# Patient Record
Sex: Male | Born: 1995 | Race: Black or African American | Hispanic: No | Marital: Single | Smoking: Never smoker
Health system: Southern US, Community
[De-identification: ages and names within clinical notes are randomized; demographics above are authoritative.]

## PROBLEM LIST (undated history)

## (undated) DIAGNOSIS — D571 Sickle-cell disease without crisis: Secondary | ICD-10-CM

## (undated) DIAGNOSIS — J45909 Unspecified asthma, uncomplicated: Secondary | ICD-10-CM

## (undated) HISTORY — PX: SPLENECTOMY: SUR1306

---
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
## 2014-03-01 ENCOUNTER — Emergency Department (HOSPITAL_COMMUNITY)
Admission: EM | Admit: 2014-03-01 | Discharge: 2014-03-01 | Disposition: A | Discharge status: Home / Self Care | Payer: No Typology Code available for payment source | Attending: Emergency Medicine | Admitting: Emergency Medicine

## 2014-03-01 ENCOUNTER — Encounter (HOSPITAL_COMMUNITY): Payer: Self-pay | Admitting: Emergency Medicine

## 2014-03-01 ENCOUNTER — Inpatient Hospital Stay (HOSPITAL_COMMUNITY)
Admission: EM | Admit: 2014-03-01 | Discharge: 2014-03-05 | DRG: 811 | Disposition: A | Discharge status: Home / Self Care | Payer: No Typology Code available for payment source | Attending: Internal Medicine | Admitting: Internal Medicine

## 2014-03-01 DIAGNOSIS — Z9081 Acquired absence of spleen: Secondary | ICD-10-CM | POA: Diagnosis present

## 2014-03-01 DIAGNOSIS — Z79899 Other long term (current) drug therapy: Secondary | ICD-10-CM

## 2014-03-01 DIAGNOSIS — J189 Pneumonia, unspecified organism: Secondary | ICD-10-CM | POA: Diagnosis not present

## 2014-03-01 DIAGNOSIS — D5700 Hb-SS disease with crisis, unspecified: Secondary | ICD-10-CM | POA: Insufficient documentation

## 2014-03-01 DIAGNOSIS — J45909 Unspecified asthma, uncomplicated: Secondary | ICD-10-CM | POA: Insufficient documentation

## 2014-03-01 DIAGNOSIS — E871 Hypo-osmolality and hyponatremia: Secondary | ICD-10-CM | POA: Insufficient documentation

## 2014-03-01 DIAGNOSIS — R0902 Hypoxemia: Secondary | ICD-10-CM | POA: Diagnosis present

## 2014-03-01 DIAGNOSIS — K5901 Slow transit constipation: Secondary | ICD-10-CM | POA: Insufficient documentation

## 2014-03-01 DIAGNOSIS — R61 Generalized hyperhidrosis: Secondary | ICD-10-CM | POA: Insufficient documentation

## 2014-03-01 DIAGNOSIS — R0602 Shortness of breath: Secondary | ICD-10-CM

## 2014-03-01 DIAGNOSIS — M545 Low back pain: Secondary | ICD-10-CM | POA: Diagnosis not present

## 2014-03-01 LAB — COMPREHENSIVE METABOLIC PANEL
ALT: 22 U/L (ref 0–53)
AST: 37 U/L (ref 0–37)
Albumin: 4.9 g/dL (ref 3.5–5.2)
Alkaline Phosphatase: 57 U/L (ref 39–117)
Anion gap: 12 (ref 5–15)
BUN: 10 mg/dL (ref 6–23)
CALCIUM: 9.7 mg/dL (ref 8.4–10.5)
CO2: 24 mmol/L (ref 19–32)
Chloride: 104 mEq/L (ref 96–112)
Creatinine, Ser: 1.04 mg/dL (ref 0.50–1.35)
GFR calc Af Amer: >90 mL/min (ref >90)
GFR calc non Af Amer: >90 mL/min (ref >90)
GLUCOSE: 103 mg/dL — AB (ref 70–99)
POTASSIUM: 3.9 mmol/L (ref 3.5–5.1)
Sodium: 140 mmol/L (ref 135–145)
Total Bilirubin: 2.3 mg/dL — ABNORMAL HIGH (ref 0.3–1.2)
Total Protein: 7.4 g/dL (ref 6.0–8.3)

## 2014-03-01 LAB — CBC WITH DIFFERENTIAL/PLATELET
Basophils Absolute: 0.1 10*3/uL (ref 0.0–0.1)
Basophils Relative: 1 % (ref 0–1)
Eosinophils Absolute: 0.3 10*3/uL (ref 0.0–0.7)
Eosinophils Relative: 2 % (ref 0–5)
HCT: 32.8 % — ABNORMAL LOW (ref 39.0–52.0)
Hemoglobin: 11.9 g/dL — ABNORMAL LOW (ref 13.0–17.0)
Lymphocytes Relative: 23 % (ref 12–46)
Lymphs Abs: 3.1 10*3/uL (ref 0.7–4.0)
MCH: 30.9 pg (ref 26.0–34.0)
MCHC: 36.3 g/dL — ABNORMAL HIGH (ref 30.0–36.0)
MCV: 85.2 fL (ref 78.0–100.0)
Monocytes Absolute: 1.1 10*3/uL — ABNORMAL HIGH (ref 0.1–1.0)
Monocytes Relative: 8 % (ref 3–12)
Neutro Abs: 8.8 10*3/uL — ABNORMAL HIGH (ref 1.7–7.7)
Neutrophils Relative %: 66 % (ref 43–77)
Platelets: 307 10*3/uL (ref 150–400)
RBC: 3.85 MIL/uL — ABNORMAL LOW (ref 4.22–5.81)
RDW: 20.7 % — ABNORMAL HIGH (ref 11.5–15.5)
WBC: 13.4 10*3/uL — ABNORMAL HIGH (ref 4.0–10.5)

## 2014-03-01 LAB — RETICULOCYTES
RBC.: 3.85 MIL/uL — ABNORMAL LOW (ref 4.22–5.81)
Retic Count, Absolute: 462 10*3/uL — ABNORMAL HIGH (ref 19.0–186.0)
Retic Ct Pct: 12 % — ABNORMAL HIGH (ref 0.4–3.1)

## 2014-03-01 MED ORDER — OXYCODONE-ACETAMINOPHEN 10-325 MG PO TABS: 1.0000 | ORAL_TABLET | Freq: Four times a day (QID) | ORAL | Status: DC | PRN

## 2014-03-01 MED ORDER — ONDANSETRON 4 MG PO TBDP: 4.0000 mg | ORAL_TABLET | Freq: Three times a day (TID) | ORAL | Status: DC | PRN

## 2014-03-01 MED ORDER — HYDROMORPHONE HCL 1 MG/ML IJ SOLN
1.0000 mg | Freq: Once | INTRAMUSCULAR | Status: AC
  Administered 2014-03-01: 1 mg via INTRAVENOUS
  Filled 2014-03-01: qty 1

## 2014-03-01 MED ORDER — HYDROMORPHONE HCL 1 MG/ML IJ SOLN
2.0000 mg | Freq: Once | INTRAMUSCULAR | Status: AC
  Administered 2014-03-01: 2 mg via INTRAVENOUS
  Filled 2014-03-01: qty 2

## 2014-03-01 MED ORDER — SODIUM CHLORIDE 0.9 % IV SOLN
Freq: Once | INTRAVENOUS | Status: AC
  Administered 2014-03-01: 13:00:00 via INTRAVENOUS

## 2014-03-01 MED ORDER — ONDANSETRON HCL 4 MG/2ML IJ SOLN
4.0000 mg | Freq: Once | INTRAMUSCULAR | Status: AC
  Administered 2014-03-01: 4 mg via INTRAVENOUS
  Filled 2014-03-01: qty 2

## 2014-03-01 MED ORDER — FENTANYL CITRATE 0.05 MG/ML IJ SOLN
100.0000 ug | Freq: Once | INTRAMUSCULAR | Status: AC
  Administered 2014-03-01: 100 ug via INTRAVENOUS
  Filled 2014-03-01: qty 2

## 2014-03-01 MED ORDER — SODIUM CHLORIDE 0.9 % IV SOLN
Freq: Once | INTRAVENOUS | Status: AC
  Administered 2014-03-01: 15:00:00 via INTRAVENOUS

## 2014-03-01 MED ORDER — OXYCODONE-ACETAMINOPHEN 5-325 MG PO TABS
2.0000 | ORAL_TABLET | Freq: Once | ORAL | Status: AC
  Administered 2014-03-01: 2 via ORAL
  Filled 2014-03-01: qty 2

## 2014-03-01 MED ORDER — LORAZEPAM 2 MG/ML IJ SOLN
1.0000 mg | Freq: Once | INTRAMUSCULAR | Status: AC
  Administered 2014-03-01: 1 mg via INTRAVENOUS
  Filled 2014-03-01: qty 1

## 2014-03-01 MED ORDER — FENTANYL CITRATE 0.05 MG/ML IJ SOLN: 100.0000 ug | Freq: Once | INTRAMUSCULAR | Status: DC

## 2014-03-01 NOTE — ED Notes (Signed)
Pt. reports recurrent low back pain / sickle cell pain this evening , pt. seen here this afternoon for the same complaint ( blood tests done ) received pain medications and discharged home with prescriptions.

## 2014-03-01 NOTE — ED Notes (Addendum)
Notified EDP of patient pain 10/10 sharp moaning and screaming.

## 2014-03-01 NOTE — ED Provider Notes (Signed)
CSN: [id number]     Arrival date & time 03/01/14  1209 History   First MD Initiated Contact with Patient 03/01/14 1226     Chief Complaint  Patient presents with  . Sickle Cell Pain Crisis     (Consider location/radiation/quality/duration/timing/severity/associated sxs/prior Treatment) Patient is a 19 y.o. male presenting with sickle cell pain. The history is provided by the patient and a relative.  Sickle Cell Pain Crisis Location:  Back Severity:  Unable to specify Onset quality:  Sudden Duration:  2 hours Similar to previous crisis episodes: yes   Timing:  Constant Progression:  Unable to specify Chronicity:  Recurrent Relieved by:  Nothing Worsened by:  Nothing tried   Past Medical History  Diagnosis Date  . Asthma   . Sickle cell disease    Past Surgical History  Procedure Laterality Date  . Splenectomy     No family history on file. History  Substance Use Topics  . Smoking status: Not on file  . Smokeless tobacco: Not on file  . Alcohol Use: No    Review of Systems  Unable to perform ROS: Acuity of condition      Allergies  Review of patient's allergies indicates no known allergies.  Home Medications   Prior to Admission medications   Medication Sig Start Date End Date Taking? Authorizing Provider  hydroxyurea (HYDREA) 500 MG capsule Take 1,500 mg by mouth daily. May take with food to minimize GI side effects.    Historical Provider, MD  ondansetron (ZOFRAN ODT) 4 MG disintegrating tablet Take 1 tablet (4 mg total) by mouth every 8 (eight) hours as needed for nausea. 05/03/12   Larene Pickett, PA-C  oxyCODONE-acetaminophen (PERCOCET) 5-325 MG per tablet Take 1 tablet by mouth every 4 (four) hours as needed for pain. 05/03/12   Larene Pickett, PA-C   BP 134/117 mmHg  Pulse 116  Temp(Src) 98.4 F (36.9 C) (Oral)  Resp 33  Ht 5\' 9"  (1.753 m)  Wt 150 lb (68.04 kg)  BMI 22.14 kg/m2  SpO2 100% Physical Exam  Constitutional: He is oriented to person,  place, and time. He appears well-developed and well-nourished. He appears distressed.  Young  M writhing in bed  HENT:  Head: Normocephalic and atraumatic.  Eyes: Conjunctivae and EOM are normal.  Cardiovascular: Normal rate and regular rhythm.   Pulmonary/Chest: Effort normal. No stridor. No respiratory distress.  Abdominal: He exhibits no distension.  Musculoskeletal: He exhibits no edema.  Neurological: He is alert and oriented to person, place, and time.  MAES  Skin: Skin is warm. He is diaphoretic.  Nursing note and vitals reviewed.   ED Course  Procedures (including critical care time) Labs Review Labs Reviewed  CBC WITH DIFFERENTIAL - Abnormal; Notable for the following:    WBC 13.4 (*)    RBC 3.85 (*)    Hemoglobin 11.9 (*)    HCT 32.8 (*)    MCHC 36.3 (*)    RDW 20.7 (*)    Neutro Abs 8.8 (*)    Monocytes Absolute 1.1 (*)    All other components within normal limits  COMPREHENSIVE METABOLIC PANEL - Abnormal; Notable for the following:    Glucose, Bld 103 (*)    Total Bilirubin 2.3 (*)    All other components within normal limits  RETICULOCYTES - Abnormal; Notable for the following:    Retic Ct Pct 12.0 (*)    RBC. 3.85 (*)    Retic Count, Manual 462.0 (*)  All other components within normal limits   Update: Patient is some potential improvement of a first dose of analgesics. He now states that he has recently been having increased his activity, and presumes that the increase is partially responsible for his pain episode. He corroborates his brothers telling of the history of present illness.  Update: Following fluids, the patient is improved.   4:18 PM Pain 2/10, patient in no distress.  MDM  Young male with sickle cell disease presents with acute low back pain. No evidence for neurologic deficiency, and the patient's vital signs are essentially stable. Patient did have some decreased respiratory drive while receiving narcotics, but was awake and alert,  with clear breath sounds, and no chest pain or dyspnea acknowledged by him. Low suspicion for acute chest or other pathology. Labs reassuring.  [name], MD 03/01/14 [phone]

## 2014-03-01 NOTE — ED Notes (Signed)
EDP at bedside  

## 2014-03-01 NOTE — Discharge Instructions (Signed)
As discussed, it is important that you follow up as soon as possible with our clinic for continued management of your condition.  If you develop any new, or concerning changes in your condition, please return to the emergency department immediately.   Sickle Cell Anemia Sickle cell anemia is a condition where your red blood cells are shaped like sickles. Red blood cells carry oxygen through the body. Sickle-shaped red blood cells do not live as long as normal red blood cells. They also clump together and block blood from flowing through the blood vessels. These things prevent the body from getting enough oxygen. Sickle cell anemia causes organ damage and pain. It also increases the risk of infection. HOME CARE  Drink enough fluid to keep your pee (urine) clear or pale yellow. Drink more in hot weather and during exercise.  Do not smoke. Smoking lowers oxygen levels in the blood.  Only take over-the-counter or prescription medicines as told by your doctor.  Take antibiotic medicines as told by your doctor. Make sure you finish them even if you start to feel better.  Take supplements as told by your doctor.  Consider wearing a medical alert bracelet. This tells anyone caring for you in an emergency of your condition.  When traveling, keep your medical information, doctors' names, and the medicines you take with you at all times.  If you have a fever, do not take fever medicines right away. This could cover up a problem. Tell your doctor.   Keep all follow-up visits with your doctor. Sickle cell anemia requires regular medical care. GET HELP IF: You have a fever. GET HELP RIGHT AWAY IF:  You feel dizzy or faint.  You have new belly (abdominal) pain, especially on the left side near the stomach area.  You have a lasting, often uncomfortable and painful erection of the penis (priapism). If it is not treated right away, you will become unable to have sex (impotence).  You have numbness  in your arms or legs or you have a hard time moving them.  You have a hard time talking.  You have a fever or lasting symptoms for more than 2-3 days.  You have a fever and your symptoms suddenly get worse.  You have signs or symptoms of infection. These include:  Chills.  Being more tired than normal (lethargy).  Irritability.  Poor eating.  Throwing up (vomiting).  You have pain that is not helped with medicine.  You have shortness of breath.  You have pain in your chest.  You are coughing up pus-like or bloody mucus.  You have a stiff neck.  Your feet or hands swell or have pain.  Your belly looks bloated.  Your joints hurt. MAKE SURE YOU:  Understand these instructions.  Will watch your condition.  Will get help right away if you are not doing well or get worse. Document Released: 11/24/2012 Document Revised: 06/20/2013 Document Reviewed: 11/24/2012 [facility] Patient Information 2015 [place], [place]. This information is not intended to replace advice given to you by your health care provider. Make sure you discuss any questions you have with your health care provider.

## 2014-03-01 NOTE — ED Notes (Signed)
States pain started after using the treadmill at the gym. States he tried to stretch his muscles but pain got worse. States has not had a sickle cell crisis in over a year.

## 2014-03-02 ENCOUNTER — Encounter (HOSPITAL_COMMUNITY): Payer: Self-pay | Admitting: Internal Medicine

## 2014-03-02 ENCOUNTER — Inpatient Hospital Stay (HOSPITAL_COMMUNITY): Payer: No Typology Code available for payment source

## 2014-03-02 DIAGNOSIS — M545 Low back pain: Secondary | ICD-10-CM | POA: Diagnosis present

## 2014-03-02 DIAGNOSIS — D72829 Elevated white blood cell count, unspecified: Secondary | ICD-10-CM

## 2014-03-02 DIAGNOSIS — Z79899 Other long term (current) drug therapy: Secondary | ICD-10-CM | POA: Diagnosis not present

## 2014-03-02 DIAGNOSIS — Z9081 Acquired absence of spleen: Secondary | ICD-10-CM | POA: Diagnosis present

## 2014-03-02 DIAGNOSIS — E871 Hypo-osmolality and hyponatremia: Secondary | ICD-10-CM | POA: Diagnosis present

## 2014-03-02 DIAGNOSIS — R0902 Hypoxemia: Secondary | ICD-10-CM | POA: Diagnosis present

## 2014-03-02 DIAGNOSIS — D5700 Hb-SS disease with crisis, unspecified: Secondary | ICD-10-CM | POA: Diagnosis present

## 2014-03-02 DIAGNOSIS — J189 Pneumonia, unspecified organism: Secondary | ICD-10-CM | POA: Diagnosis not present

## 2014-03-02 LAB — COMPREHENSIVE METABOLIC PANEL
ALBUMIN: 4.1 g/dL (ref 3.5–5.2)
ALK PHOS: 75 U/L (ref 39–117)
ALT: 20 U/L (ref 0–53)
ANION GAP: 14 (ref 5–15)
AST: 55 U/L — ABNORMAL HIGH (ref 0–37)
BILIRUBIN TOTAL: 1.5 mg/dL — AB (ref 0.3–1.2)
BUN: 10 mg/dL (ref 6–23)
CO2: 19 mmol/L (ref 19–32)
Calcium: 8.9 mg/dL (ref 8.4–10.5)
Chloride: 106 mEq/L (ref 96–112)
Creatinine, Ser: 1.02 mg/dL (ref 0.50–1.35)
GFR calc Af Amer: >90 mL/min (ref >90)
GFR calc non Af Amer: >90 mL/min (ref >90)
Glucose, Bld: 101 mg/dL — ABNORMAL HIGH (ref 70–99)
Potassium: 3.9 mmol/L (ref 3.5–5.1)
SODIUM: 139 mmol/L (ref 135–145)
Total Protein: 6.3 g/dL (ref 6.0–8.3)

## 2014-03-02 LAB — URINALYSIS, ROUTINE W REFLEX MICROSCOPIC
Bilirubin Urine: NEGATIVE
Bilirubin Urine: NEGATIVE
Glucose, UA: NEGATIVE mg/dL
Glucose, UA: NEGATIVE mg/dL
Hgb urine dipstick: NEGATIVE
Hgb urine dipstick: NEGATIVE
KETONES UR: 40 mg/dL — AB
KETONES UR: NEGATIVE mg/dL
Leukocytes, UA: NEGATIVE
Leukocytes, UA: NEGATIVE
NITRITE: NEGATIVE
NITRITE: NEGATIVE
PH: 5.5 (ref 5.0–8.0)
PROTEIN: NEGATIVE mg/dL
PROTEIN: NEGATIVE mg/dL
SPECIFIC GRAVITY, URINE: 1.013 (ref 1.005–1.030)
SPECIFIC GRAVITY, URINE: 1.012 (ref 1.005–1.030)
UROBILINOGEN UA: 1 mg/dL (ref 0.0–1.0)
UROBILINOGEN UA: 1 mg/dL (ref 0.0–1.0)
pH: 5.5 (ref 5.0–8.0)

## 2014-03-02 LAB — CBC WITH DIFFERENTIAL/PLATELET
BASOS ABS: 0.2 10*3/uL — AB (ref 0.0–0.1)
Basophils Relative: 1 % (ref 0–1)
EOS ABS: 0 10*3/uL (ref 0.0–0.7)
Eosinophils Relative: 0 % (ref 0–5)
HEMATOCRIT: 26.9 % — AB (ref 39.0–52.0)
Hemoglobin: 9.8 g/dL — ABNORMAL LOW (ref 13.0–17.0)
LYMPHS ABS: 2.3 10*3/uL (ref 0.7–4.0)
Lymphocytes Relative: 13 % (ref 12–46)
MCH: 30.4 pg (ref 26.0–34.0)
MCHC: 36.4 g/dL — AB (ref 30.0–36.0)
MCV: 83.5 fL (ref 78.0–100.0)
Monocytes Absolute: 1.9 10*3/uL — ABNORMAL HIGH (ref 0.1–1.0)
Monocytes Relative: 11 % (ref 3–12)
NEUTROS ABS: 13.1 10*3/uL — AB (ref 1.7–7.7)
NEUTROS PCT: 75 % (ref 43–77)
PLATELETS: 222 10*3/uL (ref 150–400)
RBC: 3.22 MIL/uL — ABNORMAL LOW (ref 4.22–5.81)
RDW: 21.6 % — ABNORMAL HIGH (ref 11.5–15.5)
WBC: 17.5 10*3/uL — ABNORMAL HIGH (ref 4.0–10.5)

## 2014-03-02 LAB — LACTATE DEHYDROGENASE: LDH: 590 U/L — AB (ref 94–250)

## 2014-03-02 MED ORDER — SODIUM CHLORIDE 0.45 % IV SOLN
INTRAVENOUS | Status: DC
Start: 2014-03-02 — End: 2014-03-03
  Administered 2014-03-02 – 2014-03-03 (×2): via INTRAVENOUS

## 2014-03-02 MED ORDER — FOLIC ACID 1 MG PO TABS
1.0000 mg | ORAL_TABLET | Freq: Every day | ORAL | Status: DC
  Administered 2014-03-02 – 2014-03-05 (×4): 1 mg via ORAL
  Filled 2014-03-02 (×4): qty 1

## 2014-03-02 MED ORDER — POLYETHYLENE GLYCOL 3350 17 G PO PACK
17.0000 g | PACK | Freq: Every day | ORAL | Status: DC | PRN
  Administered 2014-03-05: 17 g via ORAL
  Filled 2014-03-02: qty 1

## 2014-03-02 MED ORDER — HYDROMORPHONE 2 MG/ML HIGH CONCENTRATION IV PCA SOLN
INTRAVENOUS | Status: DC
  Administered 2014-03-02: 4 mg via INTRAVENOUS
  Administered 2014-03-02: 19:00:00 via INTRAVENOUS
  Administered 2014-03-02: 4.7 mg via INTRAVENOUS
  Administered 2014-03-03: 1.5 mg via INTRAVENOUS
  Administered 2014-03-03 (×2): 2.5 mg via INTRAVENOUS
  Administered 2014-03-03: 2 mg via INTRAVENOUS
  Administered 2014-03-03: 1.5 mg via INTRAVENOUS
  Administered 2014-03-04 (×2): 2 mg via INTRAVENOUS
  Administered 2014-03-04: 2.5 mg via INTRAVENOUS
  Filled 2014-03-02: qty 25

## 2014-03-02 MED ORDER — HYDROMORPHONE HCL 1 MG/ML IJ SOLN
1.0000 mg | INTRAMUSCULAR | Status: DC | PRN
  Administered 2014-03-02: 1 mg via INTRAVENOUS
  Filled 2014-03-02 (×2): qty 1

## 2014-03-02 MED ORDER — HYDROXYUREA 500 MG PO CAPS
1500.0000 mg | ORAL_CAPSULE | Freq: Every day | ORAL | Status: DC
  Administered 2014-03-02 – 2014-03-05 (×4): 1500 mg via ORAL
  Filled 2014-03-02 (×4): qty 3

## 2014-03-02 MED ORDER — ONDANSETRON HCL 4 MG/2ML IJ SOLN
4.0000 mg | Freq: Once | INTRAMUSCULAR | Status: AC
  Administered 2014-03-02: 4 mg via INTRAVENOUS
  Filled 2014-03-02: qty 2

## 2014-03-02 MED ORDER — NALOXONE HCL 0.4 MG/ML IJ SOLN: 0.4000 mg | INTRAMUSCULAR | Status: DC | PRN

## 2014-03-02 MED ORDER — ACETAMINOPHEN 325 MG PO TABS
650.0000 mg | ORAL_TABLET | Freq: Four times a day (QID) | ORAL | Status: DC | PRN
  Administered 2014-03-02 – 2014-03-04 (×2): 650 mg via ORAL
  Filled 2014-03-02 (×2): qty 2

## 2014-03-02 MED ORDER — SENNOSIDES-DOCUSATE SODIUM 8.6-50 MG PO TABS
1.0000 | ORAL_TABLET | Freq: Two times a day (BID) | ORAL | Status: DC
  Administered 2014-03-02 – 2014-03-05 (×6): 1 via ORAL
  Filled 2014-03-02 (×10): qty 1

## 2014-03-02 MED ORDER — HYDROMORPHONE HCL 1 MG/ML IJ SOLN
0.5000 mg | Freq: Once | INTRAMUSCULAR | Status: AC
  Administered 2014-03-02: 0.5 mg via INTRAVENOUS
  Filled 2014-03-02: qty 1

## 2014-03-02 MED ORDER — KETOROLAC TROMETHAMINE 30 MG/ML IJ SOLN
30.0000 mg | Freq: Once | INTRAMUSCULAR | Status: AC
  Administered 2014-03-02: 30 mg via INTRAVENOUS
  Filled 2014-03-02: qty 1

## 2014-03-02 MED ORDER — ENOXAPARIN SODIUM 40 MG/0.4ML ~~LOC~~ SOLN
40.0000 mg | SUBCUTANEOUS | Status: DC
  Administered 2014-03-02 – 2014-03-05 (×4): 40 mg via SUBCUTANEOUS
  Filled 2014-03-02 (×4): qty 0.4

## 2014-03-02 MED ORDER — HYDROMORPHONE 0.3 MG/ML IV SOLN
INTRAVENOUS | Status: DC
  Administered 2014-03-02: 1.2 mg via INTRAVENOUS
  Administered 2014-03-02: 3.5 mg via INTRAVENOUS
  Administered 2014-03-02: 05:00:00 via INTRAVENOUS
  Filled 2014-03-02 (×2): qty 25

## 2014-03-02 MED ORDER — SODIUM CHLORIDE 0.9 % IJ SOLN: 9.0000 mL | INTRAMUSCULAR | Status: DC | PRN

## 2014-03-02 MED ORDER — HYDROMORPHONE 2 MG/ML HIGH CONCENTRATION IV PCA SOLN: INTRAVENOUS | Status: DC

## 2014-03-02 MED ORDER — ONDANSETRON HCL 4 MG/2ML IJ SOLN: 4.0000 mg | Freq: Four times a day (QID) | INTRAMUSCULAR | Status: DC | PRN

## 2014-03-02 MED ORDER — LEVOFLOXACIN IN D5W 750 MG/150ML IV SOLN
750.0000 mg | INTRAVENOUS | Status: DC
  Administered 2014-03-02 – 2014-03-03 (×2): 750 mg via INTRAVENOUS
  Filled 2014-03-02 (×3): qty 150

## 2014-03-02 NOTE — Progress Notes (Signed)
Utilization review completed.  

## 2014-03-02 NOTE — H&P (Signed)
[facility] History and Physical  Jason Franklin Jason Franklin:[id number] DOB: 10-Jun-1995 DOA: 03/01/2014  Referring physician:ER physician. PCP: No primary care provider on file.   Chief Complaint: Low back pain.  HPI: Jason Franklin is a 19 y.o. 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 Patient has been having low back pain since yesterday morning. Patient had come to the ER earlier and was discharged home but since pain was persistent patient came back to the ER. Patient denies any chest pain or shortness of breath nausea vomiting abdominal pain diarrhea fever chills. Patient's symptoms are consistent with sickle cell pain crisis and admitted for further management. Patient denies any headache or visual symptoms any focal deficits. Patient does not take any long acting narcotic pain medications at home. He takes ibuprofen as needed.   Review of Systems: As presented in the history of presenting illness, rest negative.  Past Medical History  Diagnosis Date  . Asthma   . Sickle cell disease    Past Surgical History  Procedure Laterality Date  . Splenectomy     Social History:  reports that he has never smoked. He does not have any smokeless tobacco history on file. He reports that he does not drink alcohol or use illicit drugs. Where does patient live home. Can patient participate in ADLs? Yes.  No Known Allergies  Family History:  Family History  Problem Relation Age of Onset  . Sickle cell trait Mother   . Sickle cell trait Father   . Diabetes Mellitus II Paternal Grandmother       Prior to Admission medications   Medication Sig Start Date End Date Taking? Authorizing Provider  hydroxyurea (HYDREA) 500 MG capsule Take 1,500 mg by mouth daily. May take with food to minimize GI side effects.   Yes Historical Provider, MD  ondansetron (ZOFRAN ODT) 4 MG disintegrating tablet Take 1 tablet (4 mg total) by mouth every 8  (eight) hours as needed for nausea. 03/01/14  Yes [name], MD  oxyCODONE-acetaminophen (PERCOCET) 10-325 MG per tablet Take 1 tablet by mouth every 6 (six) hours as needed for pain. 03/01/14  Yes [name], MD    Physical Exam: Filed Vitals:   03/02/14 0300 03/02/14 0315 03/02/14 0330 03/02/14 0401  BP: 115/63 113/62 121/60   Pulse: 105 101 99   Temp:   98.4 F (36.9 C)   Resp: 23 23 24    Height:    5\' 9"  (1.753 m)  Weight:    68.04 kg (150 lb)  SpO2: 93% 95% 96%      General:  Well-developed and nourished.  Eyes: Anicteric no pallor.  ENT: No discharge from the ears eyes nose and mouth.  Neck: No mass felt.  Cardiovascular: S1-S2 heard.  Respiratory: No rhonchi or crepitations.  Abdomen: Soft nontender bowel sounds present.  Skin: No rash.  Musculoskeletal: No edema.  Psychiatric: Appears normal.  Neurologic: Alert awake oriented to time place and person. Moves all extremities.  Labs on Admission:  Basic Metabolic Panel:  Recent Labs Lab 03/01/14 1222 03/02/14 0157  NA 140 139  K 3.9 3.9  CL 104 106  CO2 24 19  GLUCOSE 103* 101*  BUN 10 10  CREATININE 1.04 1.02  CALCIUM 9.7 8.9   Liver Function Tests:  Recent Labs Lab 03/01/14 1222 03/02/14 0157  AST 37 55*  ALT 22 20  ALKPHOS 57 75  BILITOT 2.3* 1.5*  PROT 7.4  6.3  ALBUMIN 4.9 4.1   No results for input(s): LIPASE, AMYLASE in the last 168 hours. No results for input(s): AMMONIA in the last 168 hours. CBC:  Recent Labs Lab 03/01/14 1222 03/02/14 0157  WBC 13.4* 17.5*  NEUTROABS 8.8* 13.1*  HGB 11.9* 9.8*  HCT 32.8* 26.9*  MCV 85.2 83.5  PLT 307 222   Cardiac Enzymes: No results for input(s): CKTOTAL, CKMB, CKMBINDEX, TROPONINI in the last 168 hours.  BNP (last 3 results) No results for input(s): PROBNP in the last 8760 hours. CBG: No results for input(s): GLUCAP in the last 168 hours.  Radiological Exams on Admission: No results  found.   Assessment/Plan Principal Problem:   Sickle cell crisis Active Problems:   Sickle cell pain crisis   Leucocytosis   1. Sickle cell pain crisis - patient at this time is been placed on high-dose Dilaudid PCA. If pain does not get controlled and may need to go on weight-based PCA. Continue with hydration and home dose of hydroxyurea. 2. Leukocytosis - probably reactionary. Chest x-ray does not show any infiltrates and patient is not hypoxic. Urine analysis is pending. Closely observe. 3. 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  Patient will be transferred to [facility] for further care. Patient is agreeable to transfer. Dr. Posey Pronto will be the accepting physician.   DVT Prophylaxis Lovenox.  Code Status: Full code.  Family Communication: None.  Disposition Plan: Admit to inpatient.    Earley Grobe N. [facility] Pager [phone].  If 7PM-7AM, please contact night-coverage [email or web address] Password [email or web address] 03/02/2014, 4:11 AM

## 2014-03-02 NOTE — ED Provider Notes (Signed)
  Face-to-face evaluation   History: LBP for 14 hours. Unrelieved by home and ED treatments.  Physical exam: Alert, anxious. Difficult to speak because of the pain. Moving all extremities.  Medical screening examination/treatment/procedure(s) were conducted as a shared visit with non-physician practitioner(s) and myself.  I personally evaluated the patient during the encounter  Richarda Blade, MD 03/06/14 [phone]

## 2014-03-03 DIAGNOSIS — D5700 Hb-SS disease with crisis, unspecified: Principal | ICD-10-CM

## 2014-03-03 LAB — COMPREHENSIVE METABOLIC PANEL
ALT: 25 U/L (ref 0–53)
AST: 48 U/L — AB (ref 0–37)
Albumin: 3.7 g/dL (ref 3.5–5.2)
Alkaline Phosphatase: 88 U/L (ref 39–117)
Anion gap: 7 (ref 5–15)
BUN: 9 mg/dL (ref 6–23)
CO2: 25 mmol/L (ref 19–32)
Calcium: 8 mg/dL — ABNORMAL LOW (ref 8.4–10.5)
Chloride: 97 mEq/L (ref 96–112)
Creatinine, Ser: 0.74 mg/dL (ref 0.50–1.35)
GFR calc Af Amer: >90 mL/min (ref >90)
GFR calc non Af Amer: >90 mL/min (ref >90)
Glucose, Bld: 107 mg/dL — ABNORMAL HIGH (ref 70–99)
Potassium: 4.1 mmol/L (ref 3.5–5.1)
Sodium: 129 mmol/L — ABNORMAL LOW (ref 135–145)
Total Bilirubin: 2.9 mg/dL — ABNORMAL HIGH (ref 0.3–1.2)
Total Protein: 6.3 g/dL (ref 6.0–8.3)

## 2014-03-03 LAB — CBC WITH DIFFERENTIAL/PLATELET
BASOS PCT: 0 % (ref 0–1)
Basophils Absolute: 0 10*3/uL (ref 0.0–0.1)
Eosinophils Absolute: 0 10*3/uL (ref 0.0–0.7)
Eosinophils Relative: 0 % (ref 0–5)
HEMATOCRIT: 26 % — AB (ref 39.0–52.0)
HEMOGLOBIN: 9.5 g/dL — AB (ref 13.0–17.0)
LYMPHS PCT: 8 % — AB (ref 12–46)
Lymphs Abs: 1.4 10*3/uL (ref 0.7–4.0)
MCH: 30.2 pg (ref 26.0–34.0)
MCHC: 36.5 g/dL — AB (ref 30.0–36.0)
MCV: 82.5 fL (ref 78.0–100.0)
MONO ABS: 1.8 10*3/uL — AB (ref 0.1–1.0)
MONOS PCT: 10 % (ref 3–12)
Neutro Abs: 14.8 10*3/uL — ABNORMAL HIGH (ref 1.7–7.7)
Neutrophils Relative %: 82 % — ABNORMAL HIGH (ref 43–77)
PLATELETS: 168 10*3/uL (ref 150–400)
RBC: 3.15 MIL/uL — ABNORMAL LOW (ref 4.22–5.81)
RDW: 22.1 % — ABNORMAL HIGH (ref 11.5–15.5)
WBC: 18 10*3/uL — ABNORMAL HIGH (ref 4.0–10.5)
nRBC: 8 /100 WBC — ABNORMAL HIGH

## 2014-03-03 MED ORDER — KETOROLAC TROMETHAMINE 30 MG/ML IJ SOLN
30.0000 mg | Freq: Four times a day (QID) | INTRAMUSCULAR | Status: DC
  Administered 2014-03-03 – 2014-03-05 (×7): 30 mg via INTRAVENOUS
  Filled 2014-03-03 (×11): qty 1

## 2014-03-03 MED ORDER — SODIUM CHLORIDE 0.9 % IV SOLN
INTRAVENOUS | Status: DC
Start: 2014-03-03 — End: 2014-03-05
  Administered 2014-03-03 – 2014-03-05 (×5): via INTRAVENOUS

## 2014-03-03 MED ORDER — INFLUENZA VAC SPLIT QUAD 0.5 ML IM SUSY
0.5000 mL | PREFILLED_SYRINGE | INTRAMUSCULAR | Status: AC
  Administered 2014-03-04: 0.5 mL via INTRAMUSCULAR
  Filled 2014-03-03 (×2): qty 0.5

## 2014-03-03 NOTE — Progress Notes (Signed)
Pt arrived to unit. Pt stable. Agree with this am's assessment. Pt placed on telemetry, HR 109. After vitals taken and pt settled, the HR on the SpO2 pump was reading HR at 224. RN went to assess rhythm on the telemetry monitor and HR was ST with a rate of 109. The equipment appeared to be malfunctioning. RN changed finger probe on pt and changed out the entire PCA module equipment. HR still was reading in the 200's on equipment, but only 111 on the monitor. PCA module equipment changed out for a third time and the SpO2 cord was changed too. HR still reading at 200s on pump. Again on the monitor it was ST 109. Unsure why the pump reads inaccurately, but pt's HR is not that high. After a few minutes, HR on pump began to read 108. Pt calm and resting during this whole 30 minute time span. Will continue to monitor pt on telemetry.   Othella Boyer [facility] 03/03/2014

## 2014-03-03 NOTE — Progress Notes (Signed)
HR has been stable thus far on telemetry. ST 105-111.

## 2014-03-03 NOTE — Progress Notes (Addendum)
Patient ID: Jason Franklin, male   DOB: 1995/06/26, 19 y.o.   MRN: [id number] SICKLE CELL SERVICE PROGRESS NOTE  Jason Franklin [id number] DOB: January 07, 1996 DOA: 03/01/2014 PCP: No primary care provider on file.   Presenting HPI: Jason Franklin is a 19 y.o. 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 Patient has been having low back pain since yesterday morning. Patient had come to the ER earlier and was discharged home but since pain was persistent patient came back to the ER. Patient denies any chest pain or shortness of breath nausea vomiting abdominal pain diarrhea fever chills. Patient's symptoms are consistent with sickle cell pain crisis and admitted for further management. Patient denies any headache or visual symptoms any focal deficits. Patient does not take any long acting narcotic pain medications at home. He takes ibuprofen as needed.   Consultants:  none  Procedures:  none  Antibiotics:  Levaquin 1/14  HPI/Subjective: Pt states that he feels slightly better. Still having pain in his lower back that he rates as a 7/10. He is eating well and going to the restroom with assistance. He denies cough. States he felt warm a few days prior to presenting to the ER. He lives in a college dorm while attending [employer] and is unsure of any specific sick contacts.  Objective: Filed Vitals:   03/03/14 1211 03/03/14 1217 03/03/14 1404 03/03/14 1608  BP: 125/59  136/76   Pulse: 105  109   Temp: 99.4 F (37.4 C)  98.9 F (37.2 C)   TempSrc: Oral  Oral   Resp: 16 16 16 15   Height:      Weight:      SpO2: 96% 96% 96% 99%   Weight change:   Intake/Output Summary (Last 24 hours) at 03/03/14 1659 Last data filed at 03/03/14 1404  Gross per 24 hour  Intake   2300 ml  Output      0 ml  Net   2300 ml    General: Alert, awake, oriented x3, in no acute distress. Laying in bed.  HEENT: Jason Franklin/AT PEERL, EOMI Neck: Trachea midline,   no masses, no thyromegal,y no JVD, no carotid bruit OROPHARYNX:  Moist, No exudate/ erythema/lesions.  Heart: Regular rate and rhythm, without murmurs, rubs, gallops, PMI non-displaced, no heaves or thrills on palpation.  Lungs: Clear to auscultation, no wheezing or rhonchi noted. No increased vocal fremitus resonant to percussion  Abdomen: Soft, nontender, nondistended, positive bowel sounds, no masses no hepatosplenomegaly noted..  Neuro: No focal neurological deficits noted cranial nerves II through XII grossly intact. Musculoskeletal: No warm swelling or erythema around joints, no spinal tenderness noted. Psychiatric: Patient alert and oriented x3, good insight and cognition, good recent to remote recall. Lymph node survey: No cervical axillary or inguinal lymphadenopathy noted.   Data Reviewed: Basic Metabolic Panel:  Recent Labs Lab 03/01/14 1222 03/02/14 0157 03/03/14 0420  NA 140 139 129*  K 3.9 3.9 4.1  CL 104 106 97  CO2 24 19 25   GLUCOSE 103* 101* 107*  BUN 10 10 9   CREATININE 1.04 1.02 0.74  CALCIUM 9.7 8.9 8.0*   Liver Function Tests:  Recent Labs Lab 03/01/14 1222 03/02/14 0157 03/03/14 0420  AST 37 55* 48*  ALT 22 20 25   ALKPHOS 57 75 88  BILITOT 2.3* 1.5* 2.9*  PROT 7.4 6.3 6.3  ALBUMIN 4.9 4.1 3.7   No results for input(s): LIPASE, AMYLASE in  the last 168 hours. No results for input(s): AMMONIA in the last 168 hours. CBC:  Recent Labs Lab 03/01/14 1222 03/02/14 0157 03/03/14 0420  WBC 13.4* 17.5* 18.0*  NEUTROABS 8.8* 13.1* 14.8*  HGB 11.9* 9.8* 9.5*  HCT 32.8* 26.9* 26.0*  MCV 85.2 83.5 82.5  PLT 307 222 168   Studies: Dg Chest 2 View  03/02/2014   CLINICAL DATA:  Shortness of breath and fever.  Sickle cell patient.  EXAM: CHEST  2 VIEW  COMPARISON:  Radiographs and CT 05/03/2012  FINDINGS: Mild large into the cardiac silhouette, minimally progressed from prior. There is mild interstitial thickening, may reflect pulmonary edema.  Diminutive pleural effusions. Ill-defined opacity in the posterior right lower lobe. No pneumothorax. No acute osseous abnormalities are seen.  IMPRESSION: Mild cardiomegaly and likely interstitial edema with small bilateral pleural effusions. There is ill-defined opacity in the posterior right lower lobe, vascular versus pneumonia.   Electronically Signed   By: Jeb Levering M.D.   On: 03/02/2014 22:58    Scheduled Meds: . enoxaparin (LOVENOX) injection  40 mg Subcutaneous Q24H  . folic acid  1 mg Oral Daily  . HYDROmorphone PCA 2 mg/mL   Intravenous 6 times per day  . hydroxyurea  1,500 mg Oral Daily  . [START ON 03/04/2014] Influenza vac split quadrivalent PF  0.5 mL Intramuscular Tomorrow-1000  . levofloxacin (LEVAQUIN) IV  750 mg Intravenous Q24H  . senna-docusate  1 tablet Oral BID   Continuous Infusions: . sodium chloride      Principal Problem:   Sickle cell crisis Active Problems:   Sickle cell pain crisis   Leucocytosis   CAP (community acquired pneumonia)   Assessment/Plan: Principal Problem:   Sickle cell crisis Active Problems:   Sickle cell pain crisis   Leucocytosis   CAP (community acquired pneumonia)  1. CAP: Pt on Levaquin IV day 2/7. His last fever was early this morning. Hypoxia is improving. Consider switch to PO Levaquin if things continue to improve tomorrow. To get Flu vaccine prior to  Discharge. 2. Sickle Cell Crisis: Pt complains of 7/10 pain this morning. Feels pain is being relieved with PCA.  Will continue current Dilaudid PCA and start Toradol. Hgb stable, will monitor. 3. Sickle Cell Care:Continue hydroxyurea and folic acid.  4. FEN/GI :Hyponatremia of 129. Start NS @100cc , recheck in the AM. Regular Diet  IV fluids per protocol Bowel regimen in place  Code Status:full DVT Prophylaxis: enoxaparin Family Communication: none Disposition Plan: pending improvement  Time Spent: 50 minutes  Kalman Shan  Pager [phone]. If 7PM-7AM,  please contact night-coverage.  03/03/2014, 4:59 PM  LOS: 2 days   Kalman Shan

## 2014-03-04 DIAGNOSIS — E871 Hypo-osmolality and hyponatremia: Secondary | ICD-10-CM | POA: Insufficient documentation

## 2014-03-04 LAB — CBC WITH DIFFERENTIAL/PLATELET
Basophils Absolute: 0 10*3/uL (ref 0.0–0.1)
Basophils Relative: 0 % (ref 0–1)
EOS ABS: 0.3 10*3/uL (ref 0.0–0.7)
Eosinophils Relative: 2 % (ref 0–5)
HEMATOCRIT: 22.9 % — AB (ref 39.0–52.0)
Hemoglobin: 8.4 g/dL — ABNORMAL LOW (ref 13.0–17.0)
LYMPHS PCT: 19 % (ref 12–46)
Lymphs Abs: 2.4 10*3/uL (ref 0.7–4.0)
MCH: 30.2 pg (ref 26.0–34.0)
MCHC: 36.7 g/dL — ABNORMAL HIGH (ref 30.0–36.0)
MCV: 82.4 fL (ref 78.0–100.0)
MONO ABS: 1 10*3/uL (ref 0.1–1.0)
Monocytes Relative: 8 % (ref 3–12)
NRBC: 8 /100{WBCs} — AB
Neutro Abs: 9 10*3/uL — ABNORMAL HIGH (ref 1.7–7.7)
Neutrophils Relative %: 71 % (ref 43–77)
PLATELETS: 138 10*3/uL — AB (ref 150–400)
RBC: 2.78 MIL/uL — ABNORMAL LOW (ref 4.22–5.81)
RDW: 20.9 % — ABNORMAL HIGH (ref 11.5–15.5)
WBC: 12.7 10*3/uL — ABNORMAL HIGH (ref 4.0–10.5)

## 2014-03-04 LAB — BASIC METABOLIC PANEL
Anion gap: 6 (ref 5–15)
BUN: 10 mg/dL (ref 6–23)
CALCIUM: 7.9 mg/dL — AB (ref 8.4–10.5)
CO2: 27 mmol/L (ref 19–32)
Chloride: 98 mEq/L (ref 96–112)
Creatinine, Ser: 0.75 mg/dL (ref 0.50–1.35)
GFR calc Af Amer: >90 mL/min (ref >90)
GLUCOSE: 92 mg/dL (ref 70–99)
Potassium: 3.7 mmol/L (ref 3.5–5.1)
Sodium: 131 mmol/L — ABNORMAL LOW (ref 135–145)

## 2014-03-04 LAB — RETICULOCYTES
RBC.: 2.78 MIL/uL — ABNORMAL LOW (ref 4.22–5.81)
Retic Count, Absolute: 383.6 10*3/uL — ABNORMAL HIGH (ref 19.0–186.0)
Retic Ct Pct: 13.8 % — ABNORMAL HIGH (ref 0.4–3.1)

## 2014-03-04 LAB — LACTATE DEHYDROGENASE: LDH: 670 U/L — ABNORMAL HIGH (ref 94–250)

## 2014-03-04 MED ORDER — HYDROCODONE-ACETAMINOPHEN 5-325 MG PO TABS
1.0000 | ORAL_TABLET | ORAL | Status: DC | PRN
  Administered 2014-03-05: 1 via ORAL
  Filled 2014-03-04: qty 1

## 2014-03-04 MED ORDER — LEVOFLOXACIN 750 MG PO TABS
750.0000 mg | ORAL_TABLET | Freq: Every day | ORAL | Status: DC
  Administered 2014-03-04 – 2014-03-05 (×2): 750 mg via ORAL
  Filled 2014-03-04 (×2): qty 1

## 2014-03-04 MED ORDER — HYDROMORPHONE HCL 1 MG/ML IJ SOLN
1.0000 mg | Freq: Once | INTRAMUSCULAR | Status: DC
  Filled 2014-03-04: qty 1

## 2014-03-04 MED ORDER — HYDROMORPHONE HCL 1 MG/ML IJ SOLN: 1.0000 mg | INTRAMUSCULAR | Status: DC | PRN

## 2014-03-04 NOTE — Progress Notes (Signed)
Patient ID: Jason Franklin, male   DOB: [birth year], 19 y.o.   MRN: [id number] Patient ID:  Jason Franklin, male   DOB: 01/03/1996, 19 y.o.   MRN: [id number] SICKLE CELL SERVICE PROGRESS NOTE  Jason Franklin [id number] DOB: May 22, 1995 DOA: 03/01/2014 PCP: No primary care provider on file.   Presenting HPI: Jason Franklin is a 19 y.o. 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 Patient has been having low back pain since yesterday morning. Patient had come to the ER earlier and was discharged home but since pain was persistent patient came back to the ER. Patient denies any chest pain or shortness of breath nausea vomiting abdominal pain diarrhea fever chills. Patient's symptoms are consistent with sickle cell pain crisis and admitted for further management. Patient denies any headache or visual symptoms any focal deficits. Patient does not take any long acting narcotic pain medications at home. He takes ibuprofen as needed.   Consultants:  none  Procedures:  none  Antibiotics:  Levaquin 1/14  HPI/Subjective: Pt states that he is in no pain. He is using the PCA but minimally. He reports being tired and not sleeping well last night. He was afebrile overnight. He would like to be freed from all of his wires.  Objective: Filed Vitals:   03/04/14 0437 03/04/14 0438 03/04/14 0800 03/04/14 1449  BP: 116/53   103/47  Pulse: 87   104  Temp: 98 F (36.7 C)   99.1 F (37.3 C)  TempSrc: Oral   Oral  Resp: 19 16 10 14   Height:      Weight:      SpO2: 99% 99% 98% 91%   Weight change:   Intake/Output Summary (Last 24 hours) at 03/04/14 1548 Last data filed at 03/04/14 0700  Gross per 24 hour  Intake   1815 ml  Output      0 ml  Net   1815 ml    General: Alert, awake, oriented x3, in no acute distress. Laying in bed.  HEENT: [place]/AT PEERL, EOMI Neck: Trachea midline,  no masses, no thyromegal,y no JVD, no carotid  bruit OROPHARYNX:  Moist, No exudate/ erythema/lesions.  Heart: Regular rate and rhythm, without murmurs, rubs, gallops, PMI non-displaced, no heaves or thrills on palpation.  Lungs: Clear to auscultation, no wheezing or rhonchi noted. No increased vocal fremitus resonant to percussion  Abdomen: Soft, nontender, nondistended, positive bowel sounds, no masses no hepatosplenomegaly noted..  Neuro: No focal neurological deficits noted cranial nerves II through XII grossly intact.   Data Reviewed: Basic Metabolic Panel:  Recent Labs Lab 03/01/14 1222 03/02/14 0157 03/03/14 0420 03/04/14 0533  NA 140 139 129* 131*  K 3.9 3.9 4.1 3.7  CL 104 106 97 98  CO2 24 19 25 27   GLUCOSE 103* 101* 107* 92  BUN 10 10 9 10   CREATININE 1.04 1.02 0.74 0.75  CALCIUM 9.7 8.9 8.0* 7.9*   Liver Function Tests:  Recent Labs Lab 03/01/14 1222 03/02/14 0157 03/03/14 0420  AST 37 55* 48*  ALT 22 20 25   ALKPHOS 57 75 88  BILITOT 2.3* 1.5* 2.9*  PROT 7.4 6.3 6.3  ALBUMIN 4.9 4.1 3.7   No results for input(s): LIPASE, AMYLASE in the last 168 hours. No results for input(s): AMMONIA in the last 168 hours. CBC:  Recent Labs Lab 03/01/14 1222 03/02/14 0157 03/03/14 0420 03/04/14 0533  WBC 13.4* 17.5* 18.0* 12.7*  NEUTROABS 8.8*  13.1* 14.8* 9.0*  HGB 11.9* 9.8* 9.5* 8.4*  HCT 32.8* 26.9* 26.0* 22.9*  MCV 85.2 83.5 82.5 82.4  PLT 307 222 168 138*   Studies: Dg Chest 2 View  03/02/2014   CLINICAL DATA:  Shortness of breath and fever.  Sickle cell patient.  EXAM: CHEST  2 VIEW  COMPARISON:  Radiographs and CT 05/03/2012  FINDINGS: Mild large into the cardiac silhouette, minimally progressed from prior. There is mild interstitial thickening, may reflect pulmonary edema. Diminutive pleural effusions. Ill-defined opacity in the posterior right lower lobe. No pneumothorax. No acute osseous abnormalities are seen.  IMPRESSION: Mild cardiomegaly and likely interstitial edema with small bilateral pleural  effusions. There is ill-defined opacity in the posterior right lower lobe, vascular versus pneumonia.   Electronically Signed   By: Jeb Levering M.D.   On: 03/02/2014 22:58    Scheduled Meds: . enoxaparin (LOVENOX) injection  40 mg Subcutaneous Q24H  . folic acid  1 mg Oral Daily  .  HYDROmorphone (DILAUDID) injection  1 mg Intravenous Once  . hydroxyurea  1,500 mg Oral Daily  . ketorolac  30 mg Intravenous 4 times per day  . levofloxacin  750 mg Oral Daily  . senna-docusate  1 tablet Oral BID   Continuous Infusions: . sodium chloride 100 mL/hr at 03/04/14 1512    Principal Problem:   Sickle cell crisis Active Problems:   Sickle cell pain crisis   Leucocytosis   CAP (community acquired pneumonia)   Assessment/Plan: Principal Problem:   Sickle cell crisis Active Problems:   Sickle cell pain crisis   Leucocytosis   CAP (community acquired pneumonia)  1. CAP: Pt on Levaquin IV, switched to PO this morning. Antibiotic day 3/7. His last fever was over 24 hours ago. Hypoxia and tachycardia is improving. To get Flu vaccine prior to  Discharge. 2. Sickle Cell Crisis: Pt complains of minimal pain this morning. Will stop Dilaudid PCA and start Norco 5/325mg . Continue Toradol. Will add Dilaudid 1mg  q4h PRN for severe pain. Hgb slightly decreased, will monitor. 3. Sickle Cell Care:Continue hydroxyurea and folic acid.  4. FEN/GI :Hyponatremia improving now at 131. Continue NS @100cc , recheck in the AM. Regular Diet  IV fluids per protocol Bowel regimen in place  Code Status:full DVT Prophylaxis: enoxaparin Family Communication: none Disposition Plan: pending improvement  Time Spent: 25 minutes  Kalman Shan  Pager [phone]. If 7PM-7AM, please contact night-coverage.  03/04/2014, 3:48 PM  LOS: 3 days   Kalman Shan

## 2014-03-04 NOTE — ED Provider Notes (Signed)
CSN: [id number]     Arrival date & time 03/01/14  2202 History   First MD Initiated Contact with Patient 03/02/14 0007     Chief Complaint  Patient presents with  . Sickle Cell Pain Crisis     (Consider location/radiation/quality/duration/timing/severity/associated sxs/prior Treatment) Patient is a 19 y.o. male presenting with sickle cell pain. The history is provided by the patient and a relative. No language interpreter was used.  Sickle Cell Pain Crisis Location:  Back Severity:  Severe Onset quality:  Gradual Duration:  2 days Timing:  Constant Progression:  Worsening Associated symptoms comment:  Pain in low back typical for sickle cell crisis. The patient is in moderate distress secondary to pain and is not answering questions. Per his brother at bedside, the patient's last crisis was "years" ago. He is taking hydroxyurea only but does not have a primary physician. No fever, chest pain, vomiting. He was here yesterday and was able to be discharged but reports pain quickly returned at home and has been uncontrolled.    Past Medical History  Diagnosis Date  . Asthma   . Sickle cell disease    Past Surgical History  Procedure Laterality Date  . Splenectomy     Family History  Problem Relation Age of Onset  . Sickle cell trait Mother   . Sickle cell trait Father   . Diabetes Mellitus II Paternal Grandmother    History  Substance Use Topics  . Smoking status: Never Smoker   . Smokeless tobacco: Not on file  . Alcohol Use: No    Review of Systems  Unable to perform ROS     Allergies  Review of patient's allergies indicates no known allergies.  Home Medications   Prior to Admission medications   Medication Sig Start Date End Date Taking? Authorizing Provider  hydroxyurea (HYDREA) 500 MG capsule Take 1,500 mg by mouth daily. May take with food to minimize GI side effects.   Yes Historical Provider, MD  ondansetron (ZOFRAN ODT) 4 MG disintegrating tablet Take 1  tablet (4 mg total) by mouth every 8 (eight) hours as needed for nausea. 03/01/14  Yes [name], MD  oxyCODONE-acetaminophen (PERCOCET) 10-325 MG per tablet Take 1 tablet by mouth every 6 (six) hours as needed for pain. 03/01/14  Yes [name], MD   BP 103/47 mmHg  Pulse 104  Temp(Src) 99.1 F (37.3 C) (Oral)  Resp 14  Ht 5\' 9"  (1.753 m)  Wt 150 lb (68.04 kg)  BMI 22.14 kg/m2  SpO2 91% Physical Exam  Constitutional: He is oriented to person, place, and time. He appears well-developed and well-nourished. He appears distressed.  HENT:  Head: Normocephalic.  Neck: Normal range of motion. Neck supple.  Cardiovascular: Normal rate and regular rhythm.   Pulmonary/Chest: Effort normal and breath sounds normal.  Abdominal: Soft. Bowel sounds are normal. There is no tenderness. There is no rebound and no guarding.  Musculoskeletal: Normal range of motion.  Tender low back without redness, swelling.   Neurological: He is alert and oriented to person, place, and time.  Skin: Skin is warm and dry. No rash noted.  Psychiatric: He has a normal mood and affect.    ED Course  Procedures (including critical care time) Labs Review Labs Reviewed  LACTATE DEHYDROGENASE - Abnormal; Notable for the following:    LDH 590 (*)    All other components within normal limits  COMPREHENSIVE METABOLIC PANEL - Abnormal; Notable for the following:    Glucose, Bld 101 (*)  AST 55 (*)    Total Bilirubin 1.5 (*)    All other components within normal limits  CBC WITH DIFFERENTIAL - Abnormal; Notable for the following:    WBC 17.5 (*)    RBC 3.22 (*)    Hemoglobin 9.8 (*)    HCT 26.9 (*)    MCHC 36.4 (*)    RDW 21.6 (*)    Neutro Abs 13.1 (*)    Monocytes Absolute 1.9 (*)    Basophils Absolute 0.2 (*)    All other components within normal limits  URINALYSIS, ROUTINE W REFLEX MICROSCOPIC - Abnormal; Notable for the following:    Ketones, ur 40 (*)    All other components within normal  limits  CBC WITH DIFFERENTIAL - Abnormal; Notable for the following:    WBC 18.0 (*)    RBC 3.15 (*)    Hemoglobin 9.5 (*)    HCT 26.0 (*)    MCHC 36.5 (*)    RDW 22.1 (*)    Neutrophils Relative % 82 (*)    Lymphocytes Relative 8 (*)    nRBC 8 (*)    Neutro Abs 14.8 (*)    Monocytes Absolute 1.8 (*)    All other components within normal limits  COMPREHENSIVE METABOLIC PANEL - Abnormal; Notable for the following:    Sodium 129 (*)    Glucose, Bld 107 (*)    Calcium 8.0 (*)    AST 48 (*)    Total Bilirubin 2.9 (*)    All other components within normal limits  RETICULOCYTES - Abnormal; Notable for the following:    Retic Ct Pct 13.8 (*)    RBC. 2.78 (*)    Retic Count, Manual 383.6 (*)    All other components within normal limits  BASIC METABOLIC PANEL - Abnormal; Notable for the following:    Sodium 131 (*)    Calcium 7.9 (*)    All other components within normal limits  LACTATE DEHYDROGENASE - Abnormal; Notable for the following:    LDH 670 (*)    All other components within normal limits  CBC WITH DIFFERENTIAL - Abnormal; Notable for the following:    WBC 12.7 (*)    RBC 2.78 (*)    Hemoglobin 8.4 (*)    HCT 22.9 (*)    MCHC 36.7 (*)    RDW 20.9 (*)    Platelets 138 (*)    nRBC 8 (*)    Neutro Abs 9.0 (*)    All other components within normal limits  CULTURE, BLOOD (ROUTINE X 2)  CULTURE, BLOOD (ROUTINE X 2)  URINALYSIS, ROUTINE W REFLEX MICROSCOPIC  HEMOGLOBIN AND HEMATOCRIT, BLOOD  BASIC METABOLIC PANEL    Imaging Review No results found.   EKG Interpretation None      MDM   Final diagnoses:  SOB (shortness of breath)    1. Sickle cell pain crisis  This is the 2nd visit in 2 days for this patient, who continues to have significant pain. Discussed with [facility] who accepts for admission. Pain is improved with IV medications. VSS.    Dewaine Oats, PA-C 03/04/14 2015  Richarda Blade, MD 03/06/14 [place],  MD 03/06/14 [phone]

## 2014-03-04 NOTE — Progress Notes (Signed)
PCA dilaudid discontinued, 59ml IV dilaudid wasted in sink, verified by Janyce Llanos.

## 2014-03-05 DIAGNOSIS — K5901 Slow transit constipation: Secondary | ICD-10-CM | POA: Insufficient documentation

## 2014-03-05 LAB — BASIC METABOLIC PANEL
Anion gap: 5 (ref 5–15)
BUN: 10 mg/dL (ref 6–23)
CALCIUM: 8.1 mg/dL — AB (ref 8.4–10.5)
CHLORIDE: 106 meq/L (ref 96–112)
CO2: 25 mmol/L (ref 19–32)
Creatinine, Ser: 0.69 mg/dL (ref 0.50–1.35)
Glucose, Bld: 97 mg/dL (ref 70–99)
Potassium: 3.6 mmol/L (ref 3.5–5.1)
Sodium: 136 mmol/L (ref 135–145)

## 2014-03-05 LAB — HEMOGLOBIN AND HEMATOCRIT, BLOOD
HCT: 22.3 % — ABNORMAL LOW (ref 39.0–52.0)
HEMOGLOBIN: 8.1 g/dL — AB (ref 13.0–17.0)

## 2014-03-05 MED ORDER — LEVOFLOXACIN 750 MG PO TABS: 750.0000 mg | ORAL_TABLET | Freq: Every day | ORAL | Status: DC

## 2014-03-05 MED ORDER — MAGNESIUM CITRATE PO SOLN
1.0000 | Freq: Once | ORAL | Status: AC
  Administered 2014-03-05: 1 via ORAL

## 2014-03-05 NOTE — Progress Notes (Signed)
Patient discharged home with friend, discharge instructions given and explained to patient and he verbalized understanding, denies any pain/distress, accompanied home by friend. No wound noted, skin intact.

## 2014-03-05 NOTE — Discharge Summary (Signed)
Physician Discharge Summary  Jason Franklin KVQ:[id number] DOB: 1995/05/14 DOA: 03/01/2014  PCP: No primary care provider on file.  Admit date: 03/01/2014 Discharge date: 03/05/2014  Discharge Diagnoses:  Principal Problem:   Sickle cell crisis Active Problems:   Sickle cell pain crisis   Leucocytosis   CAP (community acquired pneumonia)   Hyponatremia   Discharge Condition: improved/stable  Disposition:  Follow-up Information    Schedule an appointment as soon as possible for a visit in 1 week to follow up.   Why:  primary care physician      Diet:regular  Wt Readings from Last 3 Encounters:  03/02/14 150 lb (68.04 kg)  03/01/14 150 lb (68.04 kg)    History of present illness:  Jason Franklin is a 19 y.o. 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 Patient has been having low back pain since yesterday morning. Patient had come to the ER earlier and was discharged home but since pain was persistent patient came back to the ER. Patient denies any chest pain or shortness of breath nausea vomiting abdominal pain diarrhea fever chills. Patient's symptoms are consistent with sickle cell pain crisis and admitted for further management. Patient denies any headache or visual symptoms any focal deficits. Patient does not take any long acting narcotic pain medications at home. He takes ibuprofen as needed.   Hospital Course:  Sickle Cell Crisis: Patient presented with pain characteristic of acute vaso-occlusive crisis, likely exacerbated by infection. Pt's pain was treated with bolus IV analgesics initially and was later transitioned with a Dilaudid PCA and ketorolac. PCA was decreased as pain improved and his was started on Norco as needed. On day of discharge, pt did not use any Norco in the prior 24 hours and had no complaints of pain. He had overall improvement of his pain and was physically functional upon discharge.  Community  Acquired Pneumonia: Pt had fever, hypoxia, leukocytosis, and a CXR showing a right lower lobe opacity. He was treated with Levaquin initially IV then transitioned to PO. His vitals improved. He did not have any clinical symptoms. His leukocytosis also resolved. He completed 4 out of 7 days of treatment. A prescription for Levaquin was sent to his pharmacy so he may complete course for the next days.   Hyponatremia: Pt had a sodium of 129 on admission. IV fluids were switched to NS. Hyponatremia likely due to poor PO intake. Resolved to 136 on discharge.  Anemia: Pt stated that his baseline Hgb is 8-9. He had a Hgb of 9.8 on admission to [facility]. Hgb measurement was likely due to hemoconcentration. With adequate hydration it dropped to 8.4. Hgb was 8.1 at time of discharge. He had some evidence of hemolysis as he had an LDH of 670. He has appropriate reticulocytosis. Pt advised to see PCP and have blood counts checked within 1 week.  Discharge Exam:  Filed Vitals:   03/05/14 0500  BP: 140/78  Pulse: 91  Temp: 99.7 F (37.6 C)  Resp: 20   Filed Vitals:   03/04/14 1449 03/04/14 2020 03/05/14 0226 03/05/14 0500  BP: 103/47 122/60 119/60 140/78  Pulse: 104 82 88 91  Temp: 99.1 F (37.3 C) 99.2 F (37.3 C) 98.9 F (37.2 C) 99.7 F (37.6 C)  TempSrc: Oral Oral Oral Oral  Resp: 14 18 20 20   Height:      Weight:      SpO2: 91% 96% 96% 94%  General: Alert, awake, oriented x3, in no acute distress.  HEENT: [place]/AT PEERL, EOMI Neck: Trachea midline,  no masses, no thyromegal,y no JVD, no carotid bruit OROPHARYNX:  Moist, No exudate/ erythema/lesions.  Heart: Regular rate and rhythm, without murmurs, rubs, gallops, PMI non-displaced, no heaves or thrills on palpation.  Lungs: Clear to auscultation, no wheezing or rhonchi noted. No increased vocal fremitus resonant to percussion  Abdomen: Soft, nontender, nondistended, positive bowel sounds, no masses no hepatosplenomegaly noted..  Neuro:  No focal neurological deficits noted cranial nerves II through XII grossly intact. Strength 5 out of 5 in bilateral upper and lower extremities. Nl gait. Musculoskeletal: No warm swelling or erythema around joints, no spinal tenderness noted. Psychiatric: Patient alert and oriented x3, good insight and cognition, good recent to remote recall. Lymph node survey: No cervical lymphadenopathy noted.   Discharge Instructions As above Follow-up with PCP or school based care PCP in the next week. Pt given a return to school note for 03/07/14.    Medication List    TAKE these medications        hydroxyurea 500 MG capsule  Commonly known as:  HYDREA  Take 1,500 mg by mouth daily. May take with food to minimize GI side effects.     levofloxacin 750 MG tablet  Commonly known as:  LEVAQUIN  Take 1 tablet (750 mg total) by mouth daily.     ondansetron 4 MG disintegrating tablet  Commonly known as:  ZOFRAN ODT  Take 1 tablet (4 mg total) by mouth every 8 (eight) hours as needed for nausea.     oxyCODONE-acetaminophen 10-325 MG per tablet  Commonly known as:  PERCOCET  Take 1 tablet by mouth every 6 (six) hours as needed for pain.         The results of significant diagnostics from this hospitalization (including imaging, microbiology, ancillary and laboratory) are listed below for reference.    Significant Diagnostic Studies: Dg Chest 2 View  03/02/2014   CLINICAL DATA:  Shortness of breath and fever.  Sickle cell patient.  EXAM: CHEST  2 VIEW  COMPARISON:  Radiographs and CT 05/03/2012  FINDINGS: Mild large into the cardiac silhouette, minimally progressed from prior. There is mild interstitial thickening, may reflect pulmonary edema. Diminutive pleural effusions. Ill-defined opacity in the posterior right lower lobe. No pneumothorax. No acute osseous abnormalities are seen.  IMPRESSION: Mild cardiomegaly and likely interstitial edema with small bilateral pleural effusions. There is  ill-defined opacity in the posterior right lower lobe, vascular versus pneumonia.   Electronically Signed   By: Jeb Levering M.D.   On: 03/02/2014 22:58    Microbiology: Recent Results (from the past 240 hour(s))  Culture, blood (routine x 2)     Status: None (Preliminary result)   Collection Time: 03/02/14  6:43 PM  Result Value Ref Range Status   Specimen Description BLOOD LEFT ARM  Final   Special Requests BOTTLES DRAWN AEROBIC AND ANAEROBIC 10 CC  Final   Culture   Final           BLOOD CULTURE RECEIVED NO GROWTH TO DATE CULTURE WILL BE HELD FOR 5 DAYS BEFORE ISSUING A FINAL NEGATIVE REPORT Performed at [facility]    Report Status PENDING  Incomplete  Culture, blood (routine x 2)     Status: None (Preliminary result)   Collection Time: 03/02/14  6:49 PM  Result Value Ref Range Status   Specimen Description BLOOD LEFT HAND  Final   Special Requests  BOTTLES DRAWN AEROBIC AND ANAEROBIC  8 CC  Final   Culture   Final           BLOOD CULTURE RECEIVED NO GROWTH TO DATE CULTURE WILL BE HELD FOR 5 DAYS BEFORE ISSUING A FINAL NEGATIVE REPORT Performed at [facility]    Report Status PENDING  Incomplete     Labs: Basic Metabolic Panel:  Recent Labs Lab 03/01/14 1222 03/02/14 0157 03/03/14 0420 03/04/14 0533 03/05/14 0751  NA 140 139 129* 131* 136  K 3.9 3.9 4.1 3.7 3.6  CL 104 106 97 98 106  CO2 24 19 25 27 25   GLUCOSE 103* 101* 107* 92 97  BUN 10 10 9 10 10   CREATININE 1.04 1.02 0.74 0.75 0.69  CALCIUM 9.7 8.9 8.0* 7.9* 8.1*   Liver Function Tests:  Recent Labs Lab 03/01/14 1222 03/02/14 0157 03/03/14 0420  AST 37 55* 48*  ALT 22 20 25   ALKPHOS 57 75 88  BILITOT 2.3* 1.5* 2.9*  PROT 7.4 6.3 6.3  ALBUMIN 4.9 4.1 3.7   No results for input(s): LIPASE, AMYLASE in the last 168 hours. No results for input(s): AMMONIA in the last 168 hours. CBC:  Recent Labs Lab 03/01/14 1222 03/02/14 0157 03/03/14 0420 03/04/14 0533 03/05/14 0751   WBC 13.4* 17.5* 18.0* 12.7*  --   NEUTROABS 8.8* 13.1* 14.8* 9.0*  --   HGB 11.9* 9.8* 9.5* 8.4* 8.1*  HCT 32.8* 26.9* 26.0* 22.9* 22.3*  MCV 85.2 83.5 82.5 82.4  --   PLT 307 222 168 138*  --     Time coordinating discharge: >30 minutes  Signed:  Kalman Shan  03/05/2014, 11:53 AM

## 2014-03-09 LAB — CULTURE, BLOOD (ROUTINE X 2)
Culture: NO GROWTH
Culture: NO GROWTH

## 2014-05-20 ENCOUNTER — Emergency Department (HOSPITAL_COMMUNITY): Payer: No Typology Code available for payment source

## 2014-05-20 ENCOUNTER — Inpatient Hospital Stay (HOSPITAL_COMMUNITY)
Admission: EM | Admit: 2014-05-20 | Discharge: 2014-05-24 | DRG: 811 | Disposition: A | Discharge status: Home / Self Care | Payer: No Typology Code available for payment source | Attending: Internal Medicine | Admitting: Internal Medicine

## 2014-05-20 ENCOUNTER — Encounter (HOSPITAL_COMMUNITY): Payer: Self-pay | Admitting: Emergency Medicine

## 2014-05-20 DIAGNOSIS — D5700 Hb-SS disease with crisis, unspecified: Secondary | ICD-10-CM | POA: Diagnosis present

## 2014-05-20 DIAGNOSIS — R911 Solitary pulmonary nodule: Secondary | ICD-10-CM | POA: Diagnosis present

## 2014-05-20 DIAGNOSIS — D5701 Hb-SS disease with acute chest syndrome: Secondary | ICD-10-CM | POA: Diagnosis not present

## 2014-05-20 DIAGNOSIS — J99 Respiratory disorders in diseases classified elsewhere: Secondary | ICD-10-CM | POA: Diagnosis not present

## 2014-05-20 DIAGNOSIS — Z79899 Other long term (current) drug therapy: Secondary | ICD-10-CM | POA: Diagnosis not present

## 2014-05-20 DIAGNOSIS — R0902 Hypoxemia: Secondary | ICD-10-CM | POA: Diagnosis present

## 2014-05-20 DIAGNOSIS — J209 Acute bronchitis, unspecified: Secondary | ICD-10-CM | POA: Diagnosis present

## 2014-05-20 DIAGNOSIS — R079 Chest pain, unspecified: Secondary | ICD-10-CM | POA: Diagnosis present

## 2014-05-20 DIAGNOSIS — Z9119 Patient's noncompliance with other medical treatment and regimen: Secondary | ICD-10-CM | POA: Diagnosis present

## 2014-05-20 DIAGNOSIS — Z833 Family history of diabetes mellitus: Secondary | ICD-10-CM | POA: Diagnosis not present

## 2014-05-20 DIAGNOSIS — J189 Pneumonia, unspecified organism: Secondary | ICD-10-CM | POA: Diagnosis not present

## 2014-05-20 DIAGNOSIS — Z9081 Acquired absence of spleen: Secondary | ICD-10-CM | POA: Diagnosis present

## 2014-05-20 LAB — CBC WITH DIFFERENTIAL/PLATELET
BASOS ABS: 0.2 10*3/uL — AB (ref 0.0–0.1)
BASOS PCT: 1 % (ref 0–1)
EOS PCT: 1 % (ref 0–5)
Eosinophils Absolute: 0.2 10*3/uL (ref 0.0–0.7)
HCT: 34.2 % — ABNORMAL LOW (ref 39.0–52.0)
HEMOGLOBIN: 12.3 g/dL — AB (ref 13.0–17.0)
LYMPHS PCT: 11 % — AB (ref 12–46)
Lymphs Abs: 1.7 10*3/uL (ref 0.7–4.0)
MCH: 30 pg (ref 26.0–34.0)
MCHC: 36 g/dL (ref 30.0–36.0)
MCV: 83.4 fL (ref 78.0–100.0)
MONOS PCT: 11 % (ref 3–12)
Monocytes Absolute: 1.7 10*3/uL — ABNORMAL HIGH (ref 0.1–1.0)
Neutro Abs: 11.4 10*3/uL — ABNORMAL HIGH (ref 1.7–7.7)
Neutrophils Relative %: 76 % (ref 43–77)
Platelets: 260 10*3/uL (ref 150–400)
RBC: 4.1 MIL/uL — ABNORMAL LOW (ref 4.22–5.81)
RDW: 20.7 % — AB (ref 11.5–15.5)
WBC: 15.2 10*3/uL — AB (ref 4.0–10.5)

## 2014-05-20 LAB — URINALYSIS, ROUTINE W REFLEX MICROSCOPIC
Bilirubin Urine: NEGATIVE
GLUCOSE, UA: NEGATIVE mg/dL
HGB URINE DIPSTICK: NEGATIVE
Ketones, ur: NEGATIVE mg/dL
LEUKOCYTES UA: NEGATIVE
Nitrite: NEGATIVE
Protein, ur: NEGATIVE mg/dL
Specific Gravity, Urine: 1.015 (ref 1.005–1.030)
UROBILINOGEN UA: 0.2 mg/dL (ref 0.0–1.0)
pH: 5.5 (ref 5.0–8.0)

## 2014-05-20 LAB — BASIC METABOLIC PANEL
Anion gap: 15 (ref 5–15)
BUN: 10 mg/dL (ref 6–23)
CO2: 17 mmol/L — ABNORMAL LOW (ref 19–32)
CREATININE: 0.78 mg/dL (ref 0.50–1.35)
Calcium: 9.4 mg/dL (ref 8.4–10.5)
Chloride: 105 mmol/L (ref 96–112)
GFR calc Af Amer: >90 mL/min (ref >90)
GLUCOSE: 91 mg/dL (ref 70–99)
Potassium: 3.9 mmol/L (ref 3.5–5.1)
SODIUM: 137 mmol/L (ref 135–145)

## 2014-05-20 LAB — RETICULOCYTES
RBC.: 4.04 MIL/uL — AB (ref 4.22–5.81)
RETIC COUNT ABSOLUTE: 557.5 10*3/uL — AB (ref 19.0–186.0)
Retic Ct Pct: 13.8 % — ABNORMAL HIGH (ref 0.4–3.1)

## 2014-05-20 LAB — D-DIMER, QUANTITATIVE: D-Dimer, Quant: 10.78 ug/mL-FEU — ABNORMAL HIGH (ref 0.00–0.48)

## 2014-05-20 LAB — LACTATE DEHYDROGENASE: LDH: 704 U/L — ABNORMAL HIGH (ref 94–250)

## 2014-05-20 MED ORDER — SENNOSIDES-DOCUSATE SODIUM 8.6-50 MG PO TABS
1.0000 | ORAL_TABLET | Freq: Two times a day (BID) | ORAL | Status: DC
  Administered 2014-05-21 – 2014-05-24 (×7): 1 via ORAL
  Filled 2014-05-20 (×9): qty 1

## 2014-05-20 MED ORDER — MORPHINE SULFATE 4 MG/ML IJ SOLN
6.0000 mg | Freq: Once | INTRAMUSCULAR | Status: AC
Start: 2014-05-20 — End: 2014-05-20
  Administered 2014-05-20: 6 mg via INTRAVENOUS
  Filled 2014-05-20: qty 2

## 2014-05-20 MED ORDER — SODIUM CHLORIDE 0.9 % IV SOLN
INTRAVENOUS | Status: DC
  Administered 2014-05-20 – 2014-05-21 (×2): via INTRAVENOUS

## 2014-05-20 MED ORDER — IOHEXOL 350 MG/ML SOLN
100.0000 mL | Freq: Once | INTRAVENOUS | Status: AC | PRN
  Administered 2014-05-20: 100 mL via INTRAVENOUS

## 2014-05-20 MED ORDER — HYDROMORPHONE HCL 1 MG/ML IJ SOLN
1.0000 mg | INTRAMUSCULAR | Status: DC | PRN
  Administered 2014-05-20: 1 mg via INTRAVENOUS
  Filled 2014-05-20: qty 1

## 2014-05-20 MED ORDER — HYDROMORPHONE HCL 1 MG/ML IJ SOLN
1.0000 mg | Freq: Once | INTRAMUSCULAR | Status: AC
  Administered 2014-05-20: 1 mg via INTRAVENOUS
  Filled 2014-05-20: qty 1

## 2014-05-20 MED ORDER — HYDROCODONE-ACETAMINOPHEN 5-325 MG PO TABS
1.0000 | ORAL_TABLET | ORAL | Status: DC | PRN
  Administered 2014-05-20 – 2014-05-24 (×8): 2 via ORAL
  Filled 2014-05-20 (×8): qty 2

## 2014-05-20 MED ORDER — ONDANSETRON HCL 4 MG/2ML IJ SOLN: 4.0000 mg | INTRAMUSCULAR | Status: DC | PRN

## 2014-05-20 MED ORDER — ONDANSETRON HCL 4 MG/2ML IJ SOLN
4.0000 mg | Freq: Once | INTRAMUSCULAR | Status: AC
  Administered 2014-05-20: 4 mg via INTRAVENOUS
  Filled 2014-05-20: qty 2

## 2014-05-20 MED ORDER — POLYETHYLENE GLYCOL 3350 17 G PO PACK: 17.0000 g | PACK | Freq: Every day | ORAL | Status: DC | PRN

## 2014-05-20 MED ORDER — ONDANSETRON HCL 4 MG PO TABS: 4.0000 mg | ORAL_TABLET | ORAL | Status: DC | PRN

## 2014-05-20 MED ORDER — HYDROXYUREA 500 MG PO CAPS
1500.0000 mg | ORAL_CAPSULE | Freq: Every day | ORAL | Status: DC
  Administered 2014-05-21 – 2014-05-24 (×4): 1500 mg via ORAL
  Filled 2014-05-20 (×5): qty 3

## 2014-05-20 MED ORDER — HYDROXYUREA 500 MG PO CAPS: 1500.0000 mg | ORAL_CAPSULE | Freq: Every day | ORAL | Status: DC

## 2014-05-20 MED ORDER — ACETAMINOPHEN 325 MG PO TABS
650.0000 mg | ORAL_TABLET | ORAL | Status: DC | PRN
  Administered 2014-05-21: 650 mg via ORAL
  Filled 2014-05-20: qty 2

## 2014-05-20 MED ORDER — ACETAMINOPHEN 650 MG RE SUPP: 650.0000 mg | RECTAL | Status: DC | PRN

## 2014-05-20 MED ORDER — HYDROMORPHONE HCL 1 MG/ML IJ SOLN
1.0000 mg | INTRAMUSCULAR | Status: DC | PRN
  Administered 2014-05-20 – 2014-05-21 (×8): 1 mg via INTRAVENOUS
  Filled 2014-05-20 (×8): qty 1

## 2014-05-20 MED ORDER — ALUM & MAG HYDROXIDE-SIMETH 200-200-20 MG/5ML PO SUSP: 15.0000 mL | ORAL | Status: DC | PRN

## 2014-05-20 MED ORDER — MORPHINE SULFATE 4 MG/ML IJ SOLN
4.0000 mg | Freq: Once | INTRAMUSCULAR | Status: AC
Start: 2014-05-20 — End: 2014-05-20
  Administered 2014-05-20: 4 mg via INTRAVENOUS
  Filled 2014-05-20: qty 1

## 2014-05-20 MED ORDER — FOLIC ACID 1 MG PO TABS
1.0000 mg | ORAL_TABLET | Freq: Every day | ORAL | Status: DC
  Administered 2014-05-21 – 2014-05-24 (×4): 1 mg via ORAL
  Filled 2014-05-20 (×5): qty 1

## 2014-05-20 MED ORDER — HYDROMORPHONE HCL 1 MG/ML IJ SOLN: 1.0000 mg | Freq: Once | INTRAMUSCULAR | Status: DC

## 2014-05-20 MED ORDER — HEPARIN SODIUM (PORCINE) 5000 UNIT/ML IJ SOLN
5000.0000 [IU] | Freq: Three times a day (TID) | INTRAMUSCULAR | Status: DC
  Administered 2014-05-20 – 2014-05-24 (×6): 5000 [IU] via SUBCUTANEOUS
  Filled 2014-05-20 (×15): qty 1

## 2014-05-20 MED ORDER — SODIUM CHLORIDE 0.9 % IV BOLUS (SEPSIS)
1000.0000 mL | Freq: Once | INTRAVENOUS | Status: AC
  Administered 2014-05-20: 1000 mL via INTRAVENOUS

## 2014-05-20 NOTE — ED Provider Notes (Addendum)
CSN: [id number]     Arrival date & time 05/20/14  0607 History   First MD Initiated Contact with Patient 05/20/14 [phone]     Chief Complaint  Patient presents with  . Sickle Cell Pain Crisis     (Consider location/radiation/quality/duration/timing/severity/associated sxs/prior Treatment) HPI Comments: Sincerely complaining of mid back pain and chest pain started Friday. He states often will get pain crises in his back but never in his chest. He has only had one other episode in his life for he remembers having chest pain. He is complaining of mild shortness of breath but denies any infectious symptoms such as fever, cough. Patient does have a history of asthma but denies any wheezing or feeling like this is an asthma attack. No unilateral leg pain or swelling, recent travel her prior history of blood clot  Patient is a 19 y.o. male presenting with sickle cell pain. The history is provided by the patient.  Sickle Cell Pain Crisis Location:  Chest and back Severity:  Severe Onset quality:  Gradual Duration:  2 days Similar to previous crisis episodes: no   Timing:  Constant Progression:  Worsening Chronicity:  Recurrent Sickle cell genotype:  [place] Usual hemoglobin level:  Unknown Date of last transfusion:  Unknown Frequency of attacks:  Intermittent History of pulmonary emboli: no   Context comment:  Unknown Relieved by:  Nothing Worsened by:  Movement and deep breathing Ineffective treatments:  OTC medications (ibuprofen) Associated symptoms: chest pain and shortness of breath   Associated symptoms: no congestion, no cough, no leg ulcers, no nausea, no swelling of legs, no vomiting and no wheezing   Risk factors: no cholecystectomy, no frequent admissions for fever, no frequent admissions for pain, no frequent pain crises, no hx of pneumonia, no hx of stroke, no history of acute chest syndrome and no recent air travel     Past Medical History  Diagnosis Date  . Asthma   . Sickle cell  disease    Past Surgical History  Procedure Laterality Date  . Splenectomy     Family History  Problem Relation Age of Onset  . Sickle cell trait Mother   . Sickle cell trait Father   . Diabetes Mellitus II Paternal Grandmother    History  Substance Use Topics  . Smoking status: Never Smoker   . Smokeless tobacco: Not on file  . Alcohol Use: No    Review of Systems  HENT: Negative for congestion.   Respiratory: Positive for shortness of breath. Negative for cough and wheezing.   Cardiovascular: Positive for chest pain.  Gastrointestinal: Negative for nausea and vomiting.  All other systems reviewed and are negative.     Allergies  Review of patient's allergies indicates no known allergies.  Home Medications   Prior to Admission medications   Medication Sig Start Date End Date Taking? Authorizing Provider  hydroxyurea (HYDREA) 500 MG capsule Take 1,500 mg by mouth daily. May take with food to minimize GI side effects.   Yes Historical Provider, MD  levofloxacin (LEVAQUIN) 750 MG tablet Take 1 tablet (750 mg total) by mouth daily. Patient not taking: Reported on 05/20/2014 03/05/14   [name], MD  ondansetron (ZOFRAN ODT) 4 MG disintegrating tablet Take 1 tablet (4 mg total) by mouth every 8 (eight) hours as needed for nausea. Patient not taking: Reported on 05/20/2014 03/01/14   [name], MD  oxyCODONE-acetaminophen (PERCOCET) 10-325 MG per tablet Take 1 tablet by mouth every 6 (six) hours as needed for  pain. Patient not taking: Reported on 05/20/2014 03/01/14   [name], MD   BP 118/82 mmHg  Pulse 82  Temp(Src) 98.2 F (36.8 C) (Oral)  Resp 13  SpO2 100% Physical Exam  Constitutional: He is oriented to person, place, and time. He appears well-developed and well-nourished. He appears distressed.  Hyperventilating in the room and appears to be very uncomfortable  HENT:  Head: Normocephalic and atraumatic.  Mouth/Throat: Oropharynx is clear and  moist.  Eyes: Conjunctivae and EOM are normal. Pupils are equal, round, and reactive to light.  Neck: Normal range of motion. Neck supple.  Cardiovascular: Normal rate, regular rhythm and intact distal pulses.   No murmur heard. Pulmonary/Chest: Effort normal and breath sounds normal. Tachypnea noted. No respiratory distress. He has no wheezes. He has no rales. He exhibits tenderness.    Abdominal: Soft. He exhibits no distension. There is no tenderness. There is no rebound and no guarding.  Musculoskeletal: Normal range of motion. He exhibits no edema.       Thoracic back: He exhibits tenderness.       Back:  Neurological: He is alert and oriented to person, place, and time.  Skin: Skin is warm and dry. No rash noted. No erythema.  Psychiatric: He has a normal mood and affect. His behavior is normal.  Nursing note and vitals reviewed.   ED Course  Procedures (including critical care time) Labs Review Labs Reviewed  CBC WITH DIFFERENTIAL/PLATELET - Abnormal; Notable for the following:    WBC 15.2 (*)    RBC 4.10 (*)    Hemoglobin 12.3 (*)    HCT 34.2 (*)    RDW 20.7 (*)    Lymphocytes Relative 11 (*)    Neutro Abs 11.4 (*)    Monocytes Absolute 1.7 (*)    Basophils Absolute 0.2 (*)    All other components within normal limits  BASIC METABOLIC PANEL - Abnormal; Notable for the following:    CO2 17 (*)    All other components within normal limits  D-DIMER, QUANTITATIVE - Abnormal; Notable for the following:    D-Dimer, Quant 10.78 (*)    All other components within normal limits  RETICULOCYTES - Abnormal; Notable for the following:    Retic Ct Pct 13.8 (*)    RBC. 4.04 (*)    Retic Count, Manual 557.5 (*)    All other components within normal limits  I-STAT TROPOININ, ED    Imaging Review Dg Chest 2 View  05/20/2014   CLINICAL DATA:  Sickle cell crisis with chest pain  EXAM: CHEST  2 VIEW  COMPARISON:  03/02/2014  FINDINGS: Cardiac shadow is within normal limits. The  lungs are well aerated bilaterally. No focal infiltrate or sizable effusion is seen. No acute bony abnormality is noted.  IMPRESSION: No active cardiopulmonary disease.   Electronically Signed   By: Inez Catalina M.D.   On: 05/20/2014 08:05   Ct Angio Chest Pe W/cm &/or Wo Cm  05/20/2014   CLINICAL DATA:  Elevated D-dimer, acute chest pain, sickle cell anemia  EXAM: CT ANGIOGRAPHY CHEST WITH CONTRAST  TECHNIQUE: Multidetector CT imaging of the chest was performed using the standard protocol during bolus administration of intravenous contrast. Multiplanar CT image reconstructions and MIPs were obtained to evaluate the vascular anatomy.  CONTRAST:  145mL OMNIPAQUE IOHEXOL 350 MG/ML SOLN  COMPARISON:  05/20/2014, 05/03/2012  FINDINGS: Pulmonary arteries are well visualized. No significant filling defect or pulmonary embolus demonstrated by CTA. Normal heart size. No pericardial  or pleural effusion. No adenopathy. Small nonenlarged axillary lymph nodes noted. No hiatal hernia.  Included upper abdomen demonstrates prior splenectomy. No acute upper abdominal finding.  Lung windows demonstrate a stable left apical 5 mm nodule, image 10. Minor scattered bibasilar dependent atelectasis. No focal pneumonia, collapse or consolidation. No interstitial process or edema. Negative for pneumothorax. Trachea and central airways remain patent.  No acute osseous finding.  Review of the MIP images confirms the above findings.  IMPRESSION: No significant acute pulmonary embolus by CTA.  Stable 5 mm left upper lobe subpleural pulmonary nodule since 05/03/2012 compatible with a benign nodule.  Minimal bibasilar atelectasis.  No acute intra thoracic finding.   Electronically Signed   By: Jerilynn Mages.  Shick M.D.   On: 05/20/2014 10:29     EKG Interpretation   Date/Time:  Saturday May 20 2014 07:30:32 EDT Ventricular Rate:  79 PR Interval:  147 QRS Duration: 91 QT Interval:  370 QTC Calculation: 424 R Axis:   82 Text Interpretation:   Sinus or ectopic atrial rhythm LVH by voltage ST  elev, probable normal early repol pattern No significant change since last  tracing Confirmed by Maryan Rued  MD, Loree Fee ([id number]) on 05/20/2014 7:47:50 AM      MDM   Final diagnoses:  Chest pain  Sickle cell pain crisis   Patient with a history of sickle cell disease currently on hydroxyurea who presents today with 2 day history of chest and back pain. He appears very uncomfortable on exam however has normal vital signs. No prior history of PE or acute chest. Patient states he never gets pain crises in his chest but often gets them in his back. The only medication he took prior to arrival with ibuprofen which has not improved his symptoms. Patient does have a history of asthma but denies any symptoms concerning for an asthma exacerbation such as wheezing. He is not wheezing on exam. He denies any infectious symptoms such as fever, cough or rhinorrhea.  CBC, BMP, d-dimer, troponin, reticulocyte count, EKG, chest x-ray pending. patient given pain control.   8:17 AM Improvement in pain after first round of morphine but will give additional dose to try to get pain free.  CXR and EKG without acute findings.  10:42 AM Labs are significant for acute pain crisis with a leukocytosis of 15,000 and an elevated reticulocyte count. Patient's hemoglobin today looks good at 12. D-dimer was elevated at 10. CT of his chest to rule out a PE was negative for PE. We'll continue to treat pain  12:11 PM Patient is still having significant back pain however the chest pain has improved. Because patient's pain has not significantly improved to the point where he can go home will admit for sickle cell pain crisis.  Blanchie Dessert, MD 05/20/14 9450  Blanchie Dessert, MD 05/20/14 3888  Blanchie Dessert, MD 05/20/14 1225

## 2014-05-20 NOTE — ED Notes (Signed)
Attempted to call bedside report. RN in procedure. Will call back.

## 2014-05-20 NOTE — ED Notes (Signed)
Pt reports sickle cell pain in chest and back that started Friday morning.

## 2014-05-20 NOTE — ED Notes (Signed)
Report received from previous RN, Threasa Beards.

## 2014-05-20 NOTE — H&P (Signed)
Patient Demographics  Jason Franklin, is a 19 y.o. male  MRN: [id number]   DOB - 1995-03-26  Admit Date - 05/20/2014  Outpatient Primary MD for the patient is No primary care provider on file.   With History of -  Past Medical History  Diagnosis Date  . Asthma   . Sickle cell disease       Past Surgical History  Procedure Laterality Date  . Splenectomy      in for   Chief Complaint  Patient presents with  . Sickle Cell Pain Crisis     HPI  Jason Franklin  is a 19 y.o. male, with history of sickle cell disease, on hydroxyurea, as was complaining of chest pain, and lower back pain, patient reports pain developed yesterday morning, patient does not take any narcotics chronically, only on hydroxyurea, presented to ED, workup was significant for elevated d-dimer , CT angiogram chest  was obtained, no  PE, or acute finding, patient required multiple IV pain medications, hospitalist requested to admit, most recent admission in January 2016, where patient required Dilaudid PCA, patient reports chest pain improved, denies any fever, chills, cough.    Review of Systems    In addition to the HPI above,  No Fever-chills, No Headache, No changes with Vision or hearing, No problems swallowing food or Liquids, Reports Chest pain, denies Cough or Shortness of Breath, No Abdominal pain, No Nausea or Vommitting, Bowel movements are regular, No Blood in stool or Urine, No dysuria, No new skin rashes or bruises, Reports lower back pain No new weakness, tingling, numbness in any extremity, No recent weight gain or loss, No polyuria, polydypsia or polyphagia, No significant Mental Stressors.  A full 10 point Review of Systems was done, except as stated above, all other Review of Systems were negative.   Social History History  Substance Use Topics  . Smoking status: Never Smoker   . Smokeless tobacco: Not on file  . Alcohol Use: No     Family History Family History    Problem Relation Age of Onset  . Sickle cell trait Mother   . Sickle cell trait Father   . Diabetes Mellitus II Paternal Grandmother      Prior to Admission medications   Medication Sig Start Date End Date Taking? Authorizing Provider  hydroxyurea (HYDREA) 500 MG capsule Take 1,500 mg by mouth daily. May take with food to minimize GI side effects.   Yes Historical Provider, MD  levofloxacin (LEVAQUIN) 750 MG tablet Take 1 tablet (750 mg total) by mouth daily. Patient not taking: Reported on 05/20/2014 03/05/14   [name], MD  ondansetron (ZOFRAN ODT) 4 MG disintegrating tablet Take 1 tablet (4 mg total) by mouth every 8 (eight) hours as needed for nausea. Patient not taking: Reported on 05/20/2014 03/01/14   [name], MD  oxyCODONE-acetaminophen (PERCOCET) 10-325 MG per tablet Take 1 tablet by mouth every 6 (six) hours as needed for pain. Patient not taking: Reported on 05/20/2014 03/01/14   [name], MD    No Known Allergies  Physical Exam  Vitals  Blood pressure 133/63, pulse 112, temperature 100 F (37.8 C), temperature source Oral, resp. rate 24, SpO2 90 %.   1. General well-nourished young male lying in bed in NAD,    2. Normal affect and insight, Not Suicidal or Homicidal, Awake Alert, Oriented X 3.  3. No F.N deficits, ALL C.Nerves Intact, Strength 5/5 all 4 extremities, Sensation intact all 4 extremities,  Plantars down going.  4. Ears and Eyes appear Normal, Conjunctivae clear, PERRLA. Moist Oral Mucosa.  5. Supple Neck, No JVD, No cervical lymphadenopathy appriciated, No Carotid Bruits.  6. Symmetrical Chest wall movement, Good air movement bilaterally, CTAB.  7. Tachycardic, No Gallops, Rubs or Murmurs, No Parasternal Heave.  8. Positive Bowel Sounds, Abdomen Soft, No tenderness, No organomegaly appriciated,No rebound -guarding or rigidity.  9.  No Cyanosis, Normal Skin Turgor, No Skin Rash or Bruise.  10. Good muscle tone,  joints appear  normal , no effusions, Normal ROM.  11. No Palpable Lymph Nodes in Neck or Axillae    Data Review  CBC  Recent Labs Lab 05/20/14 0800  WBC 15.2*  HGB 12.3*  HCT 34.2*  PLT 260  MCV 83.4  MCH 30.0  MCHC 36.0  RDW 20.7*  LYMPHSABS 1.7  MONOABS 1.7*  EOSABS 0.2  BASOSABS 0.2*   ------------------------------------------------------------------------------------------------------------------  Chemistries   Recent Labs Lab 05/20/14 0800  NA 137  K 3.9  CL 105  CO2 17*  GLUCOSE 91  BUN 10  CREATININE 0.78  CALCIUM 9.4   ------------------------------------------------------------------------------------------------------------------ CrCl cannot be calculated (Unknown ideal weight.). ------------------------------------------------------------------------------------------------------------------ No results for input(s): TSH, T4TOTAL, T3FREE, THYROIDAB in the last 72 hours.  Invalid input(s): FREET3   Coagulation profile No results for input(s): INR, PROTIME in the last 168 hours. -------------------------------------------------------------------------------------------------------------------  Recent Labs  05/20/14 0800  DDIMER 10.78*   -------------------------------------------------------------------------------------------------------------------  Cardiac Enzymes No results for input(s): CKMB, TROPONINI, MYOGLOBIN in the last 168 hours.  Invalid input(s): CK ------------------------------------------------------------------------------------------------------------------ Invalid input(s): POCBNP   ---------------------------------------------------------------------------------------------------------------  Urinalysis    Component Value Date/Time   COLORURINE YELLOW 03/02/2014 2015   APPEARANCEUR CLEAR 03/02/2014 2015   LABSPEC 1.012 03/02/2014 2015   PHURINE 5.5 03/02/2014 2015   GLUCOSEU NEGATIVE 03/02/2014 2015   HGBUR NEGATIVE  03/02/2014 2015   BILIRUBINUR NEGATIVE 03/02/2014 2015   KETONESUR NEGATIVE 03/02/2014 2015   PROTEINUR NEGATIVE 03/02/2014 2015   UROBILINOGEN 1.0 03/02/2014 2015   NITRITE NEGATIVE 03/02/2014 2015   LEUKOCYTESUR NEGATIVE 03/02/2014 2015    ----------------------------------------------------------------------------------------------------------------  Imaging results:   Dg Chest 2 View  05/20/2014   CLINICAL DATA:  Sickle cell crisis with chest pain  EXAM: CHEST  2 VIEW  COMPARISON:  03/02/2014  FINDINGS: Cardiac shadow is within normal limits. The lungs are well aerated bilaterally. No focal infiltrate or sizable effusion is seen. No acute bony abnormality is noted.  IMPRESSION: No active cardiopulmonary disease.   Electronically Signed   By: Inez Catalina M.D.   On: 05/20/2014 08:05   Ct Angio Chest Pe W/cm &/or Wo Cm  05/20/2014   CLINICAL DATA:  Elevated D-dimer, acute chest pain, sickle cell anemia  EXAM: CT ANGIOGRAPHY CHEST WITH CONTRAST  TECHNIQUE: Multidetector CT imaging of the chest was performed using the standard protocol during bolus administration of intravenous contrast. Multiplanar CT image reconstructions and MIPs were obtained to evaluate the vascular anatomy.  CONTRAST:  146mL OMNIPAQUE IOHEXOL 350 MG/ML SOLN  COMPARISON:  05/20/2014, 05/03/2012  FINDINGS: Pulmonary arteries are well visualized. No significant filling defect or pulmonary embolus demonstrated by CTA. Normal heart size. No pericardial or pleural effusion. No adenopathy. Small nonenlarged axillary lymph nodes noted. No hiatal hernia.  Included upper abdomen demonstrates prior splenectomy. No acute upper abdominal finding.  Lung windows demonstrate a stable left apical 5 mm nodule, image 10. Minor scattered bibasilar dependent atelectasis. No focal pneumonia, collapse or consolidation. No interstitial process or edema. Negative for pneumothorax. Trachea and  central airways remain patent.  No acute osseous finding.   Review of the MIP images confirms the above findings.  IMPRESSION: No significant acute pulmonary embolus by CTA.  Stable 5 mm left upper lobe subpleural pulmonary nodule since 05/03/2012 compatible with a benign nodule.  Minimal bibasilar atelectasis.  No acute intra thoracic finding.   Electronically Signed   By: Jerilynn Mages.  Shick M.D.   On: 05/20/2014 10:29    My personal review of EKG: Rhythm NSR, Rate  79 /min, QTc 424 , early repolarization pattern    Assessment & Plan  Active Problems:   Sickle cell crisis  Sickle cell pain crisis -  received few doses of IV Dilaudid (X2) and morphine (X4) in ED, currently appears to be comfortable, so will continue with IV Dilaudid as needed, and Percocet, if pain is not relieved, will start on Dilaudid PCA. - Continue with aggressive hydration, will give 1 L fluid bolus, continue at 125 mL/h, oxygen as needed, incentive spirometry, resume home dose hydroxyurea.   Leukocytosis - Most likely reactionary, patient when he came to the floor had fever of 100, check urine culture, and urinalysis. - No evidence of any opacity or infiltrate on CT chest, no clinical evidence of acute chest syndrome.       DVT Prophylaxis subcutaneous heparin  AM Labs Ordered, also please review Full Orders  Family Communication: Admission, patients condition and plan of care including tests being ordered have been discussed with the patient who indicate understanding and agree with the plan and Code Status.  Code Status full  Likely DC to  home when stable  Condition GUARDED    Time spent in minutes : 55 minutes   Magda Muise M.D on 05/20/2014 at 3:00 PM  Between 7am to 7pm - Pager - [phone]  After 7pm go to [email or web address] - password [email or web address]  And look for the night coverage person covering me after hours  [facility] Office  [phone]   **Disclaimer: This note may have been dictated with voice recognition software. Similar sounding  words can inadvertently be transcribed and this note may contain transcription errors which may not have been corrected upon publication of note.**

## 2014-05-21 DIAGNOSIS — D5700 Hb-SS disease with crisis, unspecified: Principal | ICD-10-CM

## 2014-05-21 LAB — CBC WITH DIFFERENTIAL/PLATELET
Basophils Absolute: 0 10*3/uL (ref 0.0–0.1)
Basophils Relative: 0 % (ref 0–1)
EOS ABS: 0.1 10*3/uL (ref 0.0–0.7)
Eosinophils Relative: 1 % (ref 0–5)
HCT: 29.2 % — ABNORMAL LOW (ref 39.0–52.0)
Hemoglobin: 10.5 g/dL — ABNORMAL LOW (ref 13.0–17.0)
LYMPHS PCT: 13 % (ref 12–46)
Lymphs Abs: 2.2 10*3/uL (ref 0.7–4.0)
MCH: 29.7 pg (ref 26.0–34.0)
MCHC: 36 g/dL (ref 30.0–36.0)
MCV: 82.5 fL (ref 78.0–100.0)
MONO ABS: 2.4 10*3/uL — AB (ref 0.1–1.0)
Monocytes Relative: 14 % — ABNORMAL HIGH (ref 3–12)
Neutro Abs: 12.6 10*3/uL — ABNORMAL HIGH (ref 1.7–7.7)
Neutrophils Relative %: 73 % (ref 43–77)
PLATELETS: 194 10*3/uL (ref 150–400)
RBC: 3.54 MIL/uL — ABNORMAL LOW (ref 4.22–5.81)
RDW: 20.3 % — ABNORMAL HIGH (ref 11.5–15.5)
WBC: 17.4 10*3/uL — AB (ref 4.0–10.5)

## 2014-05-21 LAB — COMPREHENSIVE METABOLIC PANEL
ALBUMIN: 3.9 g/dL (ref 3.5–5.2)
ALK PHOS: 101 U/L (ref 39–117)
ALT: 22 U/L (ref 0–53)
AST: 36 U/L (ref 0–37)
Anion gap: 8 (ref 5–15)
BUN: 6 mg/dL (ref 6–23)
CALCIUM: 8.5 mg/dL (ref 8.4–10.5)
CO2: 26 mmol/L (ref 19–32)
Chloride: 100 mmol/L (ref 96–112)
Creatinine, Ser: 0.66 mg/dL (ref 0.50–1.35)
GFR calc Af Amer: >90 mL/min (ref >90)
GFR calc non Af Amer: >90 mL/min (ref >90)
GLUCOSE: 105 mg/dL — AB (ref 70–99)
POTASSIUM: 3.7 mmol/L (ref 3.5–5.1)
SODIUM: 134 mmol/L — AB (ref 135–145)
Total Bilirubin: 2.1 mg/dL — ABNORMAL HIGH (ref 0.3–1.2)
Total Protein: 6.5 g/dL (ref 6.0–8.3)

## 2014-05-21 MED ORDER — MORPHINE SULFATE (PF) 1 MG/ML IV SOLN
INTRAVENOUS | Status: DC
  Administered 2014-05-21: 3 mg via INTRAVENOUS
  Administered 2014-05-21: 17:00:00 via INTRAVENOUS
  Administered 2014-05-22: 1 mg via INTRAVENOUS
  Administered 2014-05-22: 3 mg via INTRAVENOUS
  Administered 2014-05-22: 0 mg via INTRAVENOUS
  Filled 2014-05-21: qty 25

## 2014-05-21 MED ORDER — DIPHENHYDRAMINE HCL 50 MG/ML IJ SOLN: 12.5000 mg | Freq: Four times a day (QID) | INTRAMUSCULAR | Status: DC | PRN

## 2014-05-21 MED ORDER — KETOROLAC TROMETHAMINE 30 MG/ML IJ SOLN
30.0000 mg | Freq: Four times a day (QID) | INTRAMUSCULAR | Status: DC
  Administered 2014-05-21 – 2014-05-24 (×12): 30 mg via INTRAVENOUS
  Filled 2014-05-21 (×15): qty 1

## 2014-05-21 MED ORDER — DEXTROSE-NACL 5-0.45 % IV SOLN
INTRAVENOUS | Status: DC
  Administered 2014-05-21 – 2014-05-23 (×5): via INTRAVENOUS
  Administered 2014-05-23 – 2014-05-24 (×2): 1000 mL via INTRAVENOUS

## 2014-05-21 MED ORDER — SODIUM CHLORIDE 0.9 % IJ SOLN: 9.0000 mL | INTRAMUSCULAR | Status: DC | PRN

## 2014-05-21 MED ORDER — NALOXONE HCL 0.4 MG/ML IJ SOLN: 0.4000 mg | INTRAMUSCULAR | Status: DC | PRN

## 2014-05-21 MED ORDER — DIPHENHYDRAMINE HCL 12.5 MG/5ML PO ELIX: 12.5000 mg | ORAL_SOLUTION | Freq: Four times a day (QID) | ORAL | Status: DC | PRN

## 2014-05-21 NOTE — Progress Notes (Signed)
Subjective: This is a 19 year old patient with known sickle cell disease who is new to our system. He was admitted one time before with sickle cell painful crisis. He was admitted again with painful crisis as well as fever. He is all. Nave as he doesn't take pain meds on a regular basis. Patient only takes hydroxyurea at home. He was placed on 1 mg of Dilaudid every 2 hours since admission yesterday. He is complaining of chest wall pain worsened with inspiration rated as 6 out of 10. Pain is not getting adequate relief from his current regimen. He denies shortness of breath or cough. He was evaluated yesterday and pulmonary embolism was ruled out. He has also had serial cardiac enzymes checked that where negative. Patient has several concerning complain but the border on his outpatient treatment and not inpatient. He was to be referred to a hematologist. No fever no nausea vomiting or diarrhea.  Objective: Vital signs in last 24 hours: Temp:  [98.9 F (37.2 C)-102.9 F (39.4 C)] 99.8 F (37.7 C) (04/03 1355) Pulse Rate:  [100-109] 100 (04/03 1355) Resp:  [20-22] 22 (04/03 1355) BP: (129-144)/(69-85) 144/79 mmHg (04/03 1355) SpO2:  [97 %-100 %] 100 % (04/03 1355) Weight:  [65.2 kg (143 lb 11.8 oz)] 65.2 kg (143 lb 11.8 oz) (04/03 0455) Weight change:  Last BM Date: 05/18/14  Intake/Output from previous day: 04/02 0701 - 04/03 0700 In: 3025.8 [P.O.:1180; I.V.:1845.8] Out: 3000 [Urine:3000] Intake/Output this shift: Total I/O In: -  Out: 1150 [Urine:1150]  General appearance: alert, cooperative and no distress Head: Normocephalic, without obvious abnormality, atraumatic Eyes: conjunctivae/corneas clear. PERRL, EOM's intact. Fundi benign. Back: symmetric, no curvature. ROM normal. No CVA tenderness. Resp: clear to auscultation bilaterally Chest wall: no tenderness Cardio: regular rate and rhythm, S1, S2 normal, no murmur, click, rub or gallop GI: soft, non-tender; bowel sounds normal;  no masses,  no organomegaly Extremities: extremities normal, atraumatic, no cyanosis or edema Pulses: 2+ and symmetric Skin: Skin color, texture, turgor normal. No rashes or lesions Neurologic: Grossly normal  Lab Results:  Recent Labs  05/20/14 0800 05/21/14 1104  WBC 15.2* 17.4*  HGB 12.3* 10.5*  HCT 34.2* 29.2*  PLT 260 194   BMET  Recent Labs  05/20/14 0800 05/21/14 1104  NA 137 134*  K 3.9 3.7  CL 105 100  CO2 17* 26  GLUCOSE 91 105*  BUN 10 6  CREATININE 0.78 0.66  CALCIUM 9.4 8.5    Studies/Results: Dg Chest 2 View  05/20/2014   CLINICAL DATA:  Sickle cell crisis with chest pain  EXAM: CHEST  2 VIEW  COMPARISON:  03/02/2014  FINDINGS: Cardiac shadow is within normal limits. The lungs are well aerated bilaterally. No focal infiltrate or sizable effusion is seen. No acute bony abnormality is noted.  IMPRESSION: No active cardiopulmonary disease.   Electronically Signed   By: Inez Catalina M.D.   On: 05/20/2014 08:05   Ct Angio Chest Pe W/cm &/or Wo Cm  05/20/2014   CLINICAL DATA:  Elevated D-dimer, acute chest pain, sickle cell anemia  EXAM: CT ANGIOGRAPHY CHEST WITH CONTRAST  TECHNIQUE: Multidetector CT imaging of the chest was performed using the standard protocol during bolus administration of intravenous contrast. Multiplanar CT image reconstructions and MIPs were obtained to evaluate the vascular anatomy.  CONTRAST:  132mL OMNIPAQUE IOHEXOL 350 MG/ML SOLN  COMPARISON:  05/20/2014, 05/03/2012  FINDINGS: Pulmonary arteries are well visualized. No significant filling defect or pulmonary embolus demonstrated by CTA. Normal heart size.  No pericardial or pleural effusion. No adenopathy. Small nonenlarged axillary lymph nodes noted. No hiatal hernia.  Included upper abdomen demonstrates prior splenectomy. No acute upper abdominal finding.  Lung windows demonstrate a stable left apical 5 mm nodule, image 10. Minor scattered bibasilar dependent atelectasis. No focal pneumonia,  collapse or consolidation. No interstitial process or edema. Negative for pneumothorax. Trachea and central airways remain patent.  No acute osseous finding.  Review of the MIP images confirms the above findings.  IMPRESSION: No significant acute pulmonary embolus by CTA.  Stable 5 mm left upper lobe subpleural pulmonary nodule since 05/03/2012 compatible with a benign nodule.  Minimal bibasilar atelectasis.  No acute intra thoracic finding.   Electronically Signed   By: Jerilynn Mages.  Shick M.D.   On: 05/20/2014 10:29    Medications: I have reviewed the patient's current medications.  Assessment/Plan: A 19 year old gentleman with sickle cell painful crisis.  #1 sickle cell painful crisis: Patient is opiates nave. His however having pain at the moment. He is not getting adequate pain control with the Dilaudid as prescribed. He is also resistant to using the PCA because the last time he used it he went into withdrawal at home according to him. I've discussed with him that we will use lowest dose morphine PCA rather than Dilaudid. I will also add Toradol to his current regimen. Patient is agreeable to this plan I will reevaluate him in the morning.  #2 chest pain: This appears to be pleuritic secondary to sickle cell disease. There is a pulmonary nodule noted in his left upper lobe which seems to be stable. This requires outpatient follow-up.  #3 sickle cell anemia: His hemoglobin has dropped a little from 12.5-10.3 which is most likely due to hydration. We will continue to monitor.  #4 leukocytosis: Patient had low-grade temperature also. Most likely this is due to sickle cell vaso-occlusive crisis. Continue to monitor his white count.  #5 sickle cell care: Patient takes hydroxyurea at home. He does not take folic acid. We do not have patient's genotype disease type of sickle cell disease. He will be referred back to his primary care physician to plan any referral to hematologist.  LOS: 1 day    Brier Firebaugh,LAWAL 05/21/2014, 2:36 PM

## 2014-05-22 LAB — COMPREHENSIVE METABOLIC PANEL
ALT: 19 U/L (ref 0–53)
ANION GAP: 6 (ref 5–15)
AST: 26 U/L (ref 0–37)
Albumin: 3.5 g/dL (ref 3.5–5.2)
Alkaline Phosphatase: 82 U/L (ref 39–117)
BILIRUBIN TOTAL: 2.2 mg/dL — AB (ref 0.3–1.2)
BUN: 7 mg/dL (ref 6–23)
CHLORIDE: 103 mmol/L (ref 96–112)
CO2: 27 mmol/L (ref 19–32)
Calcium: 8.5 mg/dL (ref 8.4–10.5)
Creatinine, Ser: 0.61 mg/dL (ref 0.50–1.35)
GFR calc non Af Amer: >90 mL/min (ref >90)
Glucose, Bld: 99 mg/dL (ref 70–99)
POTASSIUM: 4.1 mmol/L (ref 3.5–5.1)
SODIUM: 136 mmol/L (ref 135–145)
Total Protein: 6.1 g/dL (ref 6.0–8.3)

## 2014-05-22 MED ORDER — MORPHINE SULFATE 2 MG/ML IJ SOLN
2.0000 mg | INTRAMUSCULAR | Status: DC | PRN
  Administered 2014-05-22: 2 mg via INTRAVENOUS
  Filled 2014-05-22: qty 1

## 2014-05-22 NOTE — Progress Notes (Signed)
Subjective: Patient is feeling a little better. His pain is down to 5 out of 10 in his chest. He has used the PCA yesterday and has used 7.5 mg of the morphine with only 5 demands and 5 deliveries. He is also getting the Toradol. Patient has complained that he goes the bathroom more often. He is on D5 normal saline at 125 mL an hour. He is not using much of the pain medicine as he is opiate nave. No fever, no nausea vomiting or diarrhea.  Objective: Vital signs in last 24 hours: Temp:  [98.2 F (36.8 C)-99.8 F (37.7 C)] 98.4 F (36.9 C) (04/04 1025) Pulse Rate:  [73-101] 80 (04/04 1025) Resp:  [14-26] 22 (04/04 1025) BP: (122-145)/(61-79) 122/61 mmHg (04/04 1025) SpO2:  [96 %-100 %] 99 % (04/04 1025) Weight:  [65 kg (143 lb 4.8 oz)] 65 kg (143 lb 4.8 oz) (04/04 0545) Weight change: -0.2 kg (-7 oz) Last BM Date: 05/18/14  Intake/Output from previous day: 04/03 0701 - 04/04 0700 In: 5722.4 [P.O.:240; I.V.:5482.4] Out: 2800 [Urine:2800] Intake/Output this shift:    General appearance: alert, cooperative and no distress Head: Normocephalic, without obvious abnormality, atraumatic Eyes: conjunctivae/corneas clear. PERRL, EOM's intact. Fundi benign. Back: symmetric, no curvature. ROM normal. No CVA tenderness. Resp: clear to auscultation bilaterally Chest wall: no tenderness Cardio: regular rate and rhythm, S1, S2 normal, no murmur, click, rub or gallop GI: soft, non-tender; bowel sounds normal; no masses,  no organomegaly Extremities: extremities normal, atraumatic, no cyanosis or edema Pulses: 2+ and symmetric Skin: Skin color, texture, turgor normal. No rashes or lesions Neurologic: Grossly normal  Lab Results:  Recent Labs  05/20/14 0800 05/21/14 1104  WBC 15.2* 17.4*  HGB 12.3* 10.5*  HCT 34.2* 29.2*  PLT 260 194   BMET  Recent Labs  05/21/14 1104 05/22/14 0528  NA 134* 136  K 3.7 4.1  CL 100 103  CO2 26 27  GLUCOSE 105* 99  BUN 6 7  CREATININE 0.66 0.61   CALCIUM 8.5 8.5    Studies/Results: No results found.  Medications: I have reviewed the patient's current medications.  Assessment/Plan: A 19 year old gentleman with sickle cell painful crisis.  #1 sickle cell painful crisis: Patient is improving and not use much of the PCA. I will discontinue the PCA use oral Vicodin and when necessary morphine IV. Since patient is opiate nave, he will likely not need much medicine overnight and may be ready for discharge tomorrow. Also continue Toradol.  #2 chest pain: This appears to be pleuritic secondary to sickle cell disease. There is a pulmonary nodule noted in his left upper lobe which seems to be stable. This requires outpatient follow-up.  #3 sickle cell anemia: No evidence of hemolysis. Continue monitor his hemoglobin.  #4 leukocytosis: Most likely this is due to sickle cell vaso-occlusive crisis. Continue to monitor his white count.  #5 sickle cell care: Continue hydroxyurea. Further treatment depends on his primary care physician.  LOS: 2 days   GARBA,LAWAL 05/22/2014, 10:42 AM

## 2014-05-23 ENCOUNTER — Encounter: Payer: Self-pay | Admitting: Internal Medicine

## 2014-05-23 ENCOUNTER — Inpatient Hospital Stay (HOSPITAL_COMMUNITY): Payer: No Typology Code available for payment source

## 2014-05-23 DIAGNOSIS — J209 Acute bronchitis, unspecified: Secondary | ICD-10-CM

## 2014-05-23 DIAGNOSIS — D5701 Hb-SS disease with acute chest syndrome: Secondary | ICD-10-CM | POA: Diagnosis not present

## 2014-05-23 LAB — COMPREHENSIVE METABOLIC PANEL
ALBUMIN: 3.4 g/dL — AB (ref 3.5–5.2)
ALT: 18 U/L (ref 0–53)
AST: 25 U/L (ref 0–37)
Alkaline Phosphatase: 71 U/L (ref 39–117)
Anion gap: 7 (ref 5–15)
BUN: 8 mg/dL (ref 6–23)
CHLORIDE: 103 mmol/L (ref 96–112)
CO2: 27 mmol/L (ref 19–32)
Calcium: 8.3 mg/dL — ABNORMAL LOW (ref 8.4–10.5)
Creatinine, Ser: 0.67 mg/dL (ref 0.50–1.35)
GFR calc Af Amer: >90 mL/min (ref >90)
Glucose, Bld: 89 mg/dL (ref 70–99)
POTASSIUM: 4.5 mmol/L (ref 3.5–5.1)
Sodium: 137 mmol/L (ref 135–145)
TOTAL PROTEIN: 6.3 g/dL (ref 6.0–8.3)
Total Bilirubin: 2.1 mg/dL — ABNORMAL HIGH (ref 0.3–1.2)

## 2014-05-23 LAB — CBC WITH DIFFERENTIAL/PLATELET
BASOS ABS: 0 10*3/uL (ref 0.0–0.1)
Basophils Relative: 0 % (ref 0–1)
Eosinophils Absolute: 0.7 10*3/uL (ref 0.0–0.7)
Eosinophils Relative: 4 % (ref 0–5)
HEMATOCRIT: 28.9 % — AB (ref 39.0–52.0)
Hemoglobin: 10.4 g/dL — ABNORMAL LOW (ref 13.0–17.0)
LYMPHS PCT: 14 % (ref 12–46)
Lymphs Abs: 2.4 10*3/uL (ref 0.7–4.0)
MCH: 29.8 pg (ref 26.0–34.0)
MCHC: 36 g/dL (ref 30.0–36.0)
MCV: 82.8 fL (ref 78.0–100.0)
Monocytes Absolute: 1.4 10*3/uL — ABNORMAL HIGH (ref 0.1–1.0)
Monocytes Relative: 8 % (ref 3–12)
NEUTROS ABS: 12.8 10*3/uL — AB (ref 1.7–7.7)
Neutrophils Relative %: 74 % (ref 43–77)
Platelets: 162 10*3/uL (ref 150–400)
RBC: 3.49 MIL/uL — AB (ref 4.22–5.81)
RDW: 19.6 % — AB (ref 11.5–15.5)
WBC: 17.4 10*3/uL — AB (ref 4.0–10.5)
nRBC: 2 /100 WBC — ABNORMAL HIGH

## 2014-05-23 LAB — EXPECTORATED SPUTUM ASSESSMENT W REFEX TO RESP CULTURE: Special Requests: NORMAL

## 2014-05-23 LAB — EXPECTORATED SPUTUM ASSESSMENT W GRAM STAIN, RFLX TO RESP C

## 2014-05-23 MED ORDER — ALBUTEROL SULFATE (2.5 MG/3ML) 0.083% IN NEBU
2.5000 mg | INHALATION_SOLUTION | RESPIRATORY_TRACT | Status: DC | PRN
  Administered 2014-05-23 (×2): 2.5 mg via RESPIRATORY_TRACT
  Filled 2014-05-23 (×2): qty 3

## 2014-05-23 MED ORDER — FOLIC ACID 1 MG PO TABS: 1.0000 mg | ORAL_TABLET | Freq: Every day | ORAL | Status: DC

## 2014-05-23 MED ORDER — ALBUTEROL SULFATE HFA 108 (90 BASE) MCG/ACT IN AERS: 2.0000 | INHALATION_SPRAY | RESPIRATORY_TRACT | Status: DC | PRN

## 2014-05-23 MED ORDER — AZITHROMYCIN 250 MG PO TABS: 250.0000 mg | ORAL_TABLET | Freq: Every day | ORAL | Status: DC

## 2014-05-23 MED ORDER — DEXTROSE 5 % IV SOLN
1.0000 g | Freq: Three times a day (TID) | INTRAVENOUS | Status: DC
  Administered 2014-05-23 – 2014-05-24 (×3): 1 g via INTRAVENOUS
  Filled 2014-05-23 (×4): qty 1

## 2014-05-23 MED ORDER — AZITHROMYCIN 500 MG PO TABS
500.0000 mg | ORAL_TABLET | Freq: Once | ORAL | Status: AC
  Administered 2014-05-23: 500 mg via ORAL
  Filled 2014-05-23: qty 1

## 2014-05-23 MED ORDER — VANCOMYCIN HCL IN DEXTROSE 1-5 GM/200ML-% IV SOLN
1000.0000 mg | Freq: Three times a day (TID) | INTRAVENOUS | Status: DC
  Administered 2014-05-23 – 2014-05-24 (×3): 1000 mg via INTRAVENOUS
  Filled 2014-05-23 (×4): qty 200

## 2014-05-23 MED ORDER — OXYCODONE-ACETAMINOPHEN 10-325 MG PO TABS: 1.0000 | ORAL_TABLET | Freq: Four times a day (QID) | ORAL | Status: DC | PRN

## 2014-05-23 NOTE — Progress Notes (Signed)
ANTIBIOTIC CONSULT NOTE - INITIAL  Pharmacy Consult for Vancomycin Indication: pneumonia  No Known Allergies  Patient Measurements: Height: 5\' 8"  (172.7 cm) Weight: 143 lb 4.8 oz (65 kg) IBW/kg (Calculated) : 68.4  Vital Signs: Temp: 99.3 F (37.4 C) (04/05 1245) Temp Source: Oral (04/05 1245) BP: 108/42 mmHg (04/05 1245) Pulse Rate: 112 (04/05 1245) Intake/Output from previous day: 04/04 0701 - 04/05 0700 In: 960 [P.O.:960] Out: 2700 [Urine:2700] Intake/Output from this shift: Total I/O In: 1360 [P.O.:360; I.V.:1000] Out: 1350 [Urine:1350]  Labs:  Recent Labs  05/21/14 1104 05/22/14 0528 05/23/14 0400  WBC 17.4*  --  17.4*  HGB 10.5*  --  10.4*  PLT 194  --  162  CREATININE 0.66 0.61 0.67   Estimated Creatinine Clearance: 134.3 mL/min (by C-G formula based on Cr of 0.67). No results for input(s): VANCOTROUGH, VANCOPEAK, VANCORANDOM, GENTTROUGH, GENTPEAK, GENTRANDOM, TOBRATROUGH, TOBRAPEAK, TOBRARND, AMIKACINPEAK, AMIKACINTROU, AMIKACIN in the last 72 hours.   Microbiology: No results found for this or any previous visit (from the past 720 hour(s)).  Medical History: Past Medical History  Diagnosis Date  . Asthma   . Sickle cell disease     Medications:  Scheduled:  . ceFEPime (MAXIPIME) IV  1 g Intravenous 3 times per day  . folic acid  1 mg Oral Daily  . heparin subcutaneous  5,000 Units Subcutaneous 3 times per day  . hydroxyurea  1,500 mg Oral Daily  . ketorolac  30 mg Intravenous 4 times per day  . senna-docusate  1 tablet Oral BID   Infusions:  . dextrose 5 % and 0.45% NaCl 125 mL/hr at 05/23/14 1016   Assessment:  19 yr male with h/o sickle cell disease and asthma admitted on 4/2 for sickle cell crisis and leukocytosis (no opacity or infiltrate seen on CT at that time)  This morning pt noted to have reduced oxygen level on RA; received Azithromycin 500mg  PO x 1 @ 11:23  Chest XRay today shows new bilateral lower lobe opacities  Pharmacy  consulted to dose Vancomycin for pneumonia x 8 days  Pharmacy consulted to adjust antibiotic dosages based on renal function (MD has also ordered Cefepime 1gm IV q8h)  CrCl > 120 ml/min  Blood and sputum cultures ordered  Goal of Therapy:  Vancomycin trough level 15-20 mcg/ml  Plan:  Measure antibiotic drug levels at steady state Follow up culture results  Continue Cefepime 1gm IV q8h x 8 days as ordered by MD Vancomycin 1gm IV q8h x 8 days  Everette Rank 05/23/2014,4:35 PM

## 2014-05-23 NOTE — Progress Notes (Signed)
Patient refused cont. Pulse oximeter, irritable,he said he can't sleep with the noise of the machine. I explained the importance of monitoring his O2 level,but still refused to used it. No c/o SOB at this time. Rn to continue to monitor.

## 2014-05-23 NOTE — Progress Notes (Signed)
Patient ambulated in the hall way,oxygen level checked on RA ,92-93%,denies SOB when performing the activity. Dr. Zigmund Daniel notified.

## 2014-05-23 NOTE — Progress Notes (Signed)
I came back to assess patient since having started antibiotics. He is non-toxic appearing and has normal work of breathing. He was ordered continuous pulse-ox and has refused to wear it. I have explained the risk of Acute Chest syndrome and the need to monitor his oxygen levels. Pt is alert and oriented x 3 and verbalizes understanding but is attempting to be argumentative regarding rationale for pulse-ox. Vital signs reviewed. HR noted to be 121.   I have informed patient of my recommendation and also his right to refuse treatment along with the consequences of huis refusal. He is also angry that blood cultures had to be drawn and states that he was told that he would have blood drawn only in the morning.   Assessment:  - Pt may have atelectasis rather than a pneumonia. However in  Light of the persistent hypoxemia, fevers last night and elevated WBC count and the high mortality associated with untreated Acute Chest Syndrome, I feel that it is best to treat as pneumonia. I do not feel that the current clinical status and severity of illness requires a higher level of care.    - Will ask nurse to re-check HR.

## 2014-05-23 NOTE — Discharge Summary (Signed)
Jason Franklin MRN: [id number] DOB/AGE: 05/24/1995 19 y.o.  Admit date: 05/20/2014 Discharge date: 05/23/2014  Primary Care Physician:  Benito Mccreedy, MD   Discharge Diagnoses:   Patient Active Problem List   Diagnosis Date Noted  . Acute bronchitis 05/23/2014  . Slow transit constipation   . Hyponatremia   . CAP (community acquired pneumonia) 03/03/2014  . Sickle cell crisis 03/02/2014  . Sickle cell pain crisis 03/02/2014  . Leucocytosis 03/02/2014    DISCHARGE MEDICATION:   Medication List    TAKE these medications        albuterol 108 (90 BASE) MCG/ACT inhaler  Commonly known as:  PROVENTIL HFA;VENTOLIN HFA  Inhale 2 puffs into the lungs every 4 (four) hours as needed for wheezing or shortness of breath.     azithromycin 250 MG tablet  Commonly known as:  ZITHROMAX  Take 1 tablet (250 mg total) by mouth daily.     folic acid 1 MG tablet  Commonly known as:  FOLVITE  Take 1 tablet (1 mg total) by mouth daily.     hydroxyurea 500 MG capsule  Commonly known as:  HYDREA  Take 1,500 mg by mouth daily. May take with food to minimize GI side effects.     ondansetron 4 MG disintegrating tablet  Commonly known as:  ZOFRAN ODT  Take 1 tablet (4 mg total) by mouth every 8 (eight) hours as needed for nausea.     oxyCODONE-acetaminophen 10-325 MG per tablet  Commonly known as:  PERCOCET  Take 1 tablet by mouth every 6 (six) hours as needed for pain.            SIGNIFICANT DIAGNOSTIC STUDIES:  Dg Chest 2 View  05/20/2014   CLINICAL DATA:  Sickle cell crisis with chest pain  EXAM: CHEST  2 VIEW  COMPARISON:  03/02/2014  FINDINGS: Cardiac shadow is within normal limits. The lungs are well aerated bilaterally. No focal infiltrate or sizable effusion is seen. No acute bony abnormality is noted.  IMPRESSION: No active cardiopulmonary disease.   Electronically Signed   By: Inez Catalina M.D.   On: 05/20/2014 08:05   Ct Angio Chest Pe W/cm &/or Wo Cm  05/20/2014   CLINICAL  DATA:  Elevated D-dimer, acute chest pain, sickle cell anemia  EXAM: CT ANGIOGRAPHY CHEST WITH CONTRAST  TECHNIQUE: Multidetector CT imaging of the chest was performed using the standard protocol during bolus administration of intravenous contrast. Multiplanar CT image reconstructions and MIPs were obtained to evaluate the vascular anatomy.  CONTRAST:  143mL OMNIPAQUE IOHEXOL 350 MG/ML SOLN  COMPARISON:  05/20/2014, 05/03/2012  FINDINGS: Pulmonary arteries are well visualized. No significant filling defect or pulmonary embolus demonstrated by CTA. Normal heart size. No pericardial or pleural effusion. No adenopathy. Small nonenlarged axillary lymph nodes noted. No hiatal hernia.  Included upper abdomen demonstrates prior splenectomy. No acute upper abdominal finding.  Lung windows demonstrate a stable left apical 5 mm nodule, image 10. Minor scattered bibasilar dependent atelectasis. No focal pneumonia, collapse or consolidation. No interstitial process or edema. Negative for pneumothorax. Trachea and central airways remain patent.  No acute osseous finding.  Review of the MIP images confirms the above findings.  IMPRESSION: No significant acute pulmonary embolus by CTA.  Stable 5 mm left upper lobe subpleural pulmonary nodule since 05/03/2012 compatible with a benign nodule.  Minimal bibasilar atelectasis.  No acute intra thoracic finding.   Electronically Signed   By: Jerilynn Mages.  Shick M.D.   On: 05/20/2014 10:29  No results found for this or any previous visit (from the past 240 hour(s)).  BRIEF ADMITTING H & P: Jason Franklin is a 19 y.o. male, with history of sickle cell disease, on hydroxyurea, as was complaining of chest pain, and lower back pain, patient reports pain developed yesterday morning, patient does not take any narcotics chronically, only on hydroxyurea, presented to ED, workup was significant for elevated d-dimer , CT angiogram chest was obtained, no PE, or acute finding, patient required  multiple IV pain medications, hospitalist requested to admit, most recent admission in January 2016, where patient required Dilaudid PCA, patient reports chest pain improved, denies any fever, chills, cough.   Hospital Course:  Present on Admission:  . Sickle cell crisis: Pt with Hb SS (by review of records from [facility]) was admitted with acute crisis. He is opiate naive and was treated with full dose PCA and then transitioned to oral analgesics which he has used at a frequency of about every 6 hours. He rates his pain at 4/10 at time of discharge and states that his baseline pain is usually 0-3/10. He usually does not have analgesic medications at home and his last prescription was from the Hospital in January. Pt expresses difficulty with access to his medications with his emphasis of concern being Hydrea for which he states he only gets 60-90 pills without refills. He also does not have any analgesics at home but is less concerned as he states that if he is on his Hydrea consistently he rarely has pain. 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 I will discharge him on a short prescription of Oxycodone 10-325 mg #30 tabs. He is to follow up with Dr. Ruthine Dose.   Marland Kitchen Acute Bronchitis: Pt has acute bronchitis with nasal congestion. 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 I will also give patient an Albuterol inhaler to be used PRN.   . Sickle Cell Disease: Pt was previously seen by Dr. Angelina Ok at [facility]. He has done very well on a stable dose of Hydrea 1500 mg (23 mg/kg) for many years. I recommend that he continue on current dose of Hydrea. However, pt has not been compliant with his appointments with Dr. Ruthine Dose and has not kept up with surveillance of blood for use of Hydrea.  Disposition and Follow-up: Pt is being discharged home in stable condition and is to follow up with  Dr. Ruthine Dose in 1 week.  Discussed with Dr. Ruthine Dose.   DISCHARGE EXAM:  General: Alert, awake, oriented x3, in no acute distress.  Vital Signs: BP 111/60, HR 96, T 100.1 F (37.8 C), temperature source Oral, RR 20, height 5\' 8"  (1.727 m), weight 143 lb 4.8 oz (65 kg), SpO2 100 %. HEENT: [place]/AT PEERL, EOMI, anicteric Neck: Trachea midline, no masses, no thyromegal,y no JVD, no carotid bruit OROPHARYNX: Moist, No exudate/ erythema/lesions.  Heart: Regular rate and rhythm, without murmurs, rubs, gallops or S3. PMI non-displaced. Exam reveals no decreased pulses. Pulmonary/Chest: Normal effort. Breath sounds normal. No. Apnea. Clear to auscultation,no stridor.  Mild wheezing but no rhonchi noted. No respiratory distress and no tenderness noted. Abdomen: Soft, nontender, nondistended, normal bowel sounds, no masses no hepatosplenomegaly noted. No fluid wave and no ascites. There is no guarding or rebound. Neuro: Alert and oriented to person, place and time. Normal motor skills, Displays no atrophy or tremors and exhibits normal muscle tone.  No  focal neurological deficits noted cranial nerves II through XII grossly intact. No sensory deficit noted. Strength at baseline in bilateral upper and lower extremities. Gait normal. Musculoskeletal: No warm swelling or erythema around joints, no spinal tenderness noted. Psychiatric: Patient alert and oriented x3, good insight and cognition, good recent to remote recall. Lymph node survey: No cervical axillary or inguinal lymphadenopathy noted. Skin: Skin is warm and dry. No bruising, no ecchymosis and no rash noted. Pt is not diaphoretic. No erythema. No pallor     Recent Labs  05/22/14 0528 05/23/14 0400  NA 136 137  K 4.1 4.5  CL 103 103  CO2 27 27  GLUCOSE 99 89  BUN 7 8  CREATININE 0.61 0.67  CALCIUM 8.5 8.3*    Recent Labs  05/22/14 0528 05/23/14 0400  AST 26 25  ALT 19 18  ALKPHOS 82 71  BILITOT 2.2* 2.1*  PROT 6.1 6.3   ALBUMIN 3.5 3.4*   No results for input(s): LIPASE, AMYLASE in the last 72 hours.  Recent Labs  05/21/14 1104 05/23/14 0400  WBC 17.4* 17.4*  NEUTROABS 12.6* 12.8*  HGB 10.5* 10.4*  HCT 29.2* 28.9*  MCV 82.5 82.8  PLT 194 162     Total time spent including face to face and decision making was greater than 30 minutes  Signed: Bryton Romagnoli A. 05/23/2014, 10:37 AM

## 2014-05-23 NOTE — Progress Notes (Signed)
Dr. Zigmund Daniel mentioned to hold the discharge.

## 2014-05-23 NOTE — Progress Notes (Signed)
Received a cal;l from nurse stating that patient has had a decrease in Oxygen saturations. His saturations on RA are decreasing to 86%. Will obtain a CXR to evaluate for a lung process.

## 2014-05-23 NOTE — Progress Notes (Signed)
Patient's oxygen level was 88% on RA,no c/o SOB, incentive spirometer encouraged and patient said he had used the device,Dr. Zigmund Daniel notified.

## 2014-05-24 DIAGNOSIS — J189 Pneumonia, unspecified organism: Secondary | ICD-10-CM

## 2014-05-24 LAB — BASIC METABOLIC PANEL
Anion gap: 6 (ref 5–15)
BUN: 11 mg/dL (ref 6–23)
CO2: 28 mmol/L (ref 19–32)
Calcium: 8.2 mg/dL — ABNORMAL LOW (ref 8.4–10.5)
Chloride: 106 mmol/L (ref 96–112)
Creatinine, Ser: 0.69 mg/dL (ref 0.50–1.35)
GFR calc Af Amer: >90 mL/min (ref >90)
GFR calc non Af Amer: >90 mL/min (ref >90)
GLUCOSE: 99 mg/dL (ref 70–99)
POTASSIUM: 3.8 mmol/L (ref 3.5–5.1)
Sodium: 140 mmol/L (ref 135–145)

## 2014-05-24 LAB — RETICULOCYTES
RBC.: 2.72 MIL/uL — ABNORMAL LOW (ref 4.22–5.81)
RETIC COUNT ABSOLUTE: 242.1 10*3/uL — AB (ref 19.0–186.0)
Retic Ct Pct: 8.9 % — ABNORMAL HIGH (ref 0.4–3.1)

## 2014-05-24 LAB — CBC WITH DIFFERENTIAL/PLATELET
BASOS PCT: 0 % (ref 0–1)
Basophils Absolute: 0.1 10*3/uL (ref 0.0–0.1)
EOS ABS: 0.9 10*3/uL — AB (ref 0.0–0.7)
Eosinophils Relative: 5 % (ref 0–5)
HCT: 22.7 % — ABNORMAL LOW (ref 39.0–52.0)
Hemoglobin: 8.2 g/dL — ABNORMAL LOW (ref 13.0–17.0)
Lymphocytes Relative: 20 % (ref 12–46)
Lymphs Abs: 3.5 10*3/uL (ref 0.7–4.0)
MCH: 30.1 pg (ref 26.0–34.0)
MCHC: 36.1 g/dL — AB (ref 30.0–36.0)
MCV: 83.5 fL (ref 78.0–100.0)
Monocytes Absolute: 1.4 10*3/uL — ABNORMAL HIGH (ref 0.1–1.0)
Monocytes Relative: 8 % (ref 3–12)
NEUTROS PCT: 68 % (ref 43–77)
Neutro Abs: 12.2 10*3/uL — ABNORMAL HIGH (ref 1.7–7.7)
PLATELETS: 198 10*3/uL (ref 150–400)
RBC: 2.72 MIL/uL — ABNORMAL LOW (ref 4.22–5.81)
RDW: 20.3 % — ABNORMAL HIGH (ref 11.5–15.5)
WBC: 18 10*3/uL — ABNORMAL HIGH (ref 4.0–10.5)

## 2014-05-24 LAB — HIV ANTIBODY (ROUTINE TESTING W REFLEX): HIV Screen 4th Generation wRfx: NONREACTIVE

## 2014-05-24 LAB — LEGIONELLA ANTIGEN, URINE

## 2014-05-24 LAB — STREP PNEUMONIAE URINARY ANTIGEN: Strep Pneumo Urinary Antigen: NEGATIVE

## 2014-05-24 MED ORDER — LEVOFLOXACIN 750 MG PO TABS
750.0000 mg | ORAL_TABLET | Freq: Every day | ORAL | Status: DC
  Administered 2014-05-24: 750 mg via ORAL
  Filled 2014-05-24 (×2): qty 1

## 2014-05-24 MED ORDER — LEVOFLOXACIN 750 MG PO TABS
750.0000 mg | ORAL_TABLET | Freq: Every day | ORAL | Status: DC
Start: 2014-05-24 — End: 2014-06-07

## 2014-05-24 NOTE — Progress Notes (Signed)
Patient ambulated in the hallway,pulse rate obtained-106.

## 2014-05-24 NOTE — Progress Notes (Signed)
Patient d/c instructions given,teach back utilized,verbalized understanding,prescription given. Stable.

## 2014-05-24 NOTE — Progress Notes (Signed)
Pt discharged to home. Left unit in good condition ambulating to checkout accompanied by nurse tech to meet ride downstairs. Left in good condition. Vwilliams, rn.

## 2014-05-24 NOTE — Progress Notes (Signed)
Patient is alert and orientedx3. Denies SOB,O2 saturation was 94% at rest,pulse rate -86,pain level of 2/10. RN to continue to monitor.

## 2014-05-24 NOTE — Discharge Summary (Signed)
Jason Franklin MRN: [id number] DOB/AGE: [birth year] 21 y.o.  Admit date: 05/20/2014 Discharge date: 05/24/2014  Primary Care Physician:  Jason Mccreedy, MD   Discharge Diagnoses:   Patient Active Problem List   Diagnosis Date Noted  . Acute bronchitis 05/23/2014  . Acute chest syndrome 05/23/2014  . Slow transit constipation   . Hyponatremia   . CAP (community acquired pneumonia) 03/03/2014  . Sickle cell crisis 03/02/2014  . Sickle cell pain crisis 03/02/2014  . Leucocytosis 03/02/2014    DISCHARGE MEDICATION:   Medication List    TAKE these medications        albuterol 108 (90 BASE) MCG/ACT inhaler  Commonly known as:  PROVENTIL HFA;VENTOLIN HFA  Inhale 2 puffs into the lungs every 4 (four) hours as needed for wheezing or shortness of breath.     Levofloxacin 750 mg  Commonly known as:  LEVAQUIN  Take 1 tablet (750 mg total) by mouth daily.     folic acid 1 MG tablet  Commonly known as:  FOLVITE  Take 1 tablet (1 mg total) by mouth daily.     hydroxyurea 500 MG capsule  Commonly known as:  HYDREA  Take 1,500 mg by mouth daily. May take with food to minimize GI side effects.     ondansetron 4 MG disintegrating tablet  Commonly known as:  ZOFRAN ODT  Take 1 tablet (4 mg total) by mouth every 8 (eight) hours as needed for nausea.     oxyCODONE-acetaminophen 10-325 MG per tablet  Commonly known as:  PERCOCET  Take 1 tablet by mouth every 6 (six) hours as needed for pain.            SIGNIFICANT DIAGNOSTIC STUDIES:  Dg Chest 2 View  05/23/2014   CLINICAL DATA:  Subsequent encounter shortness of breath productive cough, history of sickle cell and asthma  EXAM: CHEST  2 VIEW  COMPARISON:  05/20/2014 radiographs and CT thorax  FINDINGS: Mild increased cardiac silhouette when compared to prior study. Vascular pattern normal.  New pulmonary parenchymal opacity in the right lower lobe and left lower lobe.  IMPRESSION: New bilateral lower lobe opacities concerning for  Acute Chest Syndrome. Possible causes include pulmonary infarctions as well as bilateral pneumonia.   Electronically Signed   By: Skipper Cliche M.D.   On: 05/23/2014 15:38   Dg Chest 2 View  05/20/2014   CLINICAL DATA:  Sickle cell crisis with chest pain  EXAM: CHEST  2 VIEW  COMPARISON:  03/02/2014  FINDINGS: Cardiac shadow is within normal limits. The lungs are well aerated bilaterally. No focal infiltrate or sizable effusion is seen. No acute bony abnormality is noted.  IMPRESSION: No active cardiopulmonary disease.   Electronically Signed   By: Inez Catalina M.D.   On: 05/20/2014 08:05   Ct Angio Chest Pe W/cm &/or Wo Cm  05/20/2014   CLINICAL DATA:  Elevated D-dimer, acute chest pain, sickle cell anemia  EXAM: CT ANGIOGRAPHY CHEST WITH CONTRAST  TECHNIQUE: Multidetector CT imaging of the chest was performed using the standard protocol during bolus administration of intravenous contrast. Multiplanar CT image reconstructions and MIPs were obtained to evaluate the vascular anatomy.  CONTRAST:  126mL OMNIPAQUE IOHEXOL 350 MG/ML SOLN  COMPARISON:  05/20/2014, 05/03/2012  FINDINGS: Pulmonary arteries are well visualized. No significant filling defect or pulmonary embolus demonstrated by CTA. Normal heart size. No pericardial or pleural effusion. No adenopathy. Small nonenlarged axillary lymph nodes noted. No hiatal hernia.  Included upper abdomen demonstrates prior splenectomy. No  acute upper abdominal finding.  Lung windows demonstrate a stable left apical 5 mm nodule, image 10. Minor scattered bibasilar dependent atelectasis. No focal pneumonia, collapse or consolidation. No interstitial process or edema. Negative for pneumothorax. Trachea and central airways remain patent.  No acute osseous finding.  Review of the MIP images confirms the above findings.  IMPRESSION: No significant acute pulmonary embolus by CTA.  Stable 5 mm left upper lobe subpleural pulmonary nodule since 05/03/2012 compatible with a benign  nodule.  Minimal bibasilar atelectasis.  No acute intra thoracic finding.   Electronically Signed   By: Jerilynn Mages.  Shick M.D.   On: 05/20/2014 10:29      Recent Results (from the past 240 hour(s))  Culture, sputum-assessment     Status: None   Collection Time: 05/23/14  7:31 PM  Result Value Ref Range Status   Specimen Description SPUTUM  Final   Special Requests Normal  Final   Sputum evaluation   Final    THIS SPECIMEN IS ACCEPTABLE. RESPIRATORY CULTURE REPORT TO FOLLOW.   Report Status 05/23/2014 FINAL  Final  Culture, respiratory (NON-Expectorated)     Status: None (Preliminary result)   Collection Time: 05/23/14  7:31 PM  Result Value Ref Range Status   Specimen Description SPUTUM  Final   Special Requests NONE  Final   Gram Stain   Final    FEW WBC PRESENT, PREDOMINANTLY PMN RARE SQUAMOUS EPITHELIAL CELLS PRESENT FEW GRAM POSITIVE COCCI IN PAIRS RARE GRAM POSITIVE RODS Performed at [facility]    Culture PENDING  Incomplete   Report Status PENDING  Incomplete    BRIEF ADMITTING H & P: Jason Franklin is a 19 y.o. male, with history of sickle cell disease, on hydroxyurea, as was complaining of chest pain, and lower back pain, patient reports pain developed yesterday morning, patient does not take any narcotics chronically, only on hydroxyurea, presented to ED, workup was significant for elevated d-dimer , CT angiogram chest was obtained, no PE, or acute finding, patient required multiple IV pain medications, hospitalist requested to admit, most recent admission in January 2016, where patient required Dilaudid PCA, patient reports chest pain improved, denies any fever, chills, cough.   Hospital Course:  Present on Admission:  . Sickle cell crisis: Pt with Hb SS (by review of records from [facility]) was admitted with acute crisis. He is opiate naive and was treated with full dose PCA and then transitioned to oral analgesics which he has used at a frequency of about every 6 hours.  He rates his pain at 4/10 at time of discharge and states that his baseline pain is usually 0-3/10. He usually does not have analgesic medications at home and his last prescription was from the Hospital in January. Pt expresses difficulty with access to his medications with his emphasis of concern being Hydrea for which he states he only gets 60-90 pills without refills. He also does not have any analgesics at home but is less concerned as he states that if he is on his Hydrea consistently he rarely has pain. 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 I will discharge him on a short prescription of Oxycodone 10-325 mg #30 tabs. He is to follow up with Dr. Ruthine Dose.   . CAP: Pt had symptoms of an acute bronchitis with nasal congestion. 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 However prior to discharge he demonstrated hypoxemia  with ambulation and an elevated HR. A CXR showed B/L opacities. He was treated as pneumonia and started on antibiotics and given supportive care. Today his Oxygenation and HR have both improved.   He is being discharged on 6 additional days of Levaquin 750 mg. He is to follow up with his PMD in 48 hours.   . Sickle Cell Disease: Pt was previously seen by Dr. Angelina Ok at [facility]. He has done very well on a stable dose of Hydrea 1500 mg (23 mg/kg) for many years. I recommend that he continue on current dose of Hydrea. However, pt has not been compliant with his appointments with Dr. Ruthine Dose and has not kept up with surveillance of blood for use of Hydrea.  Disposition and Follow-up: Pt is being discharged home in stable condition and is to follow up with Dr. Ruthine Dose in 1 week.  Discussed with Dr. Ruthine Dose. Discharge Instructions    Activity as tolerated - No restrictions    Complete by:  As directed      Diet general    Complete by:  As directed             DISCHARGE EXAM:   Filed Vitals:   05/24/14 1322  BP: 136/63  Pulse: 88  Temp: 97.8 F (36.6 C)  Resp: 18   General: Alert, awake, oriented x3, in no acute distress.  HEENT: [place]/AT PEERL, EOMI, anicteric Neck: Trachea midline, no masses, no thyromegal,y no JVD, no carotid bruit OROPHARYNX: Moist, No exudate/ erythema/lesions.  Heart: Regular rate and rhythm, without murmurs, rubs, gallops or S3. PMI non-displaced. Exam reveals no decreased pulses. Pulmonary/Chest: Normal effort. Breath sounds normal. No. Apnea. Clear to auscultation,no stridor.  Mild wheezing but no rhonchi noted. No respiratory distress and no tenderness noted. Abdomen: Soft, nontender, nondistended, normal bowel sounds, no masses no hepatosplenomegaly noted. No fluid wave and no ascites. There is no guarding or rebound. Neuro: Alert and oriented to person, place and time. Normal motor skills, Displays no atrophy or tremors and exhibits normal muscle tone.  No focal neurological deficits noted cranial nerves II through XII grossly intact. No sensory deficit noted. Strength at baseline in bilateral upper and lower extremities. Gait normal. Musculoskeletal: No warm swelling or erythema around joints, no spinal tenderness noted. Psychiatric: Patient alert and oriented x3, good insight and cognition, good recent to remote recall. Lymph node survey: No cervical axillary or inguinal lymphadenopathy noted. Skin: Skin is warm and dry. No bruising, no ecchymosis and no rash noted. Pt is not diaphoretic. No erythema. No pallor    Recent Labs  05/23/14 0400 05/24/14 0355  NA 137 140  K 4.5 3.8  CL 103 106  CO2 27 28  GLUCOSE 89 99  BUN 8 11  CREATININE 0.67 0.69  CALCIUM 8.3* 8.2*    Recent Labs  05/22/14 0528 05/23/14 0400  AST 26 25  ALT 19 18  ALKPHOS 82 71  BILITOT 2.2* 2.1*  PROT 6.1 6.3  ALBUMIN 3.5 3.4*   No results for input(s): LIPASE, AMYLASE in the last 72 hours.  Recent Labs   05/23/14 0400 05/24/14 0355  WBC 17.4* 18.0*  NEUTROABS 12.8* 12.2*  HGB 10.4* 8.2*  HCT 28.9* 22.7*  MCV 82.8 83.5  PLT 162 198     Total time spent including face to face and decision making was greater than 30 minutes  Signed: Kimberley Speece A. 05/24/2014, 2:09 PM

## 2014-05-24 NOTE — Plan of Care (Signed)
Problem: Phase II Progression Outcomes Goal: Tolerating diet Outcome: Progressing Patient denies nausea.

## 2014-05-26 LAB — CULTURE, RESPIRATORY W GRAM STAIN

## 2014-05-26 LAB — CULTURE, RESPIRATORY: CULTURE: NORMAL

## 2014-05-29 LAB — HEMOGLOBINOPATHY EVALUATION
HGB F QUANT: 12.3 % — AB (ref 0.0–2.0)
Hgb A2 Quant: 4.6 % — ABNORMAL HIGH (ref 0.7–3.1)
Hgb A: 0 % — ABNORMAL LOW (ref 94.0–98.0)
Hgb C: 0 %
Hgb S Quant: 83.1 % — ABNORMAL HIGH

## 2014-05-30 LAB — CULTURE, BLOOD (ROUTINE X 2)
CULTURE: NO GROWTH
Culture: NO GROWTH

## 2014-06-02 ENCOUNTER — Inpatient Hospital Stay (HOSPITAL_COMMUNITY): Payer: No Typology Code available for payment source

## 2014-06-02 ENCOUNTER — Emergency Department (HOSPITAL_COMMUNITY): Payer: No Typology Code available for payment source

## 2014-06-02 ENCOUNTER — Inpatient Hospital Stay (HOSPITAL_COMMUNITY)
Admission: EM | Admit: 2014-06-02 | Discharge: 2014-06-07 | DRG: 871 | Disposition: A | Discharge status: Home / Self Care | Payer: No Typology Code available for payment source | Attending: Internal Medicine | Admitting: Internal Medicine

## 2014-06-02 ENCOUNTER — Encounter (HOSPITAL_COMMUNITY): Payer: Self-pay | Admitting: Emergency Medicine

## 2014-06-02 DIAGNOSIS — Z833 Family history of diabetes mellitus: Secondary | ICD-10-CM | POA: Diagnosis not present

## 2014-06-02 DIAGNOSIS — E871 Hypo-osmolality and hyponatremia: Secondary | ICD-10-CM | POA: Diagnosis present

## 2014-06-02 DIAGNOSIS — J181 Lobar pneumonia, unspecified organism: Secondary | ICD-10-CM | POA: Diagnosis present

## 2014-06-02 DIAGNOSIS — R059 Cough, unspecified: Secondary | ICD-10-CM

## 2014-06-02 DIAGNOSIS — A419 Sepsis, unspecified organism: Principal | ICD-10-CM | POA: Diagnosis present

## 2014-06-02 DIAGNOSIS — Y95 Nosocomial condition: Secondary | ICD-10-CM | POA: Diagnosis present

## 2014-06-02 DIAGNOSIS — J189 Pneumonia, unspecified organism: Secondary | ICD-10-CM | POA: Diagnosis present

## 2014-06-02 DIAGNOSIS — R05 Cough: Secondary | ICD-10-CM

## 2014-06-02 DIAGNOSIS — D75839 Thrombocytosis, unspecified: Secondary | ICD-10-CM

## 2014-06-02 DIAGNOSIS — Z79899 Other long term (current) drug therapy: Secondary | ICD-10-CM | POA: Diagnosis not present

## 2014-06-02 DIAGNOSIS — D5700 Hb-SS disease with crisis, unspecified: Secondary | ICD-10-CM | POA: Diagnosis present

## 2014-06-02 DIAGNOSIS — D72829 Elevated white blood cell count, unspecified: Secondary | ICD-10-CM | POA: Diagnosis not present

## 2014-06-02 DIAGNOSIS — D473 Essential (hemorrhagic) thrombocythemia: Secondary | ICD-10-CM | POA: Diagnosis present

## 2014-06-02 DIAGNOSIS — R0602 Shortness of breath: Secondary | ICD-10-CM | POA: Diagnosis not present

## 2014-06-02 DIAGNOSIS — Z9081 Acquired absence of spleen: Secondary | ICD-10-CM | POA: Diagnosis present

## 2014-06-02 DIAGNOSIS — D5701 Hb-SS disease with acute chest syndrome: Secondary | ICD-10-CM

## 2014-06-02 DIAGNOSIS — D571 Sickle-cell disease without crisis: Secondary | ICD-10-CM | POA: Diagnosis present

## 2014-06-02 LAB — COMPREHENSIVE METABOLIC PANEL
ALBUMIN: 4.2 g/dL (ref 3.5–5.2)
ALK PHOS: 76 U/L (ref 39–117)
ALT: 15 U/L (ref 0–53)
AST: 18 U/L (ref 0–37)
Anion gap: 10 (ref 5–15)
BUN: 9 mg/dL (ref 6–23)
CALCIUM: 9.3 mg/dL (ref 8.4–10.5)
CO2: 24 mmol/L (ref 19–32)
Chloride: 97 mmol/L (ref 96–112)
Creatinine, Ser: 0.84 mg/dL (ref 0.50–1.35)
GFR calc Af Amer: >90 mL/min (ref >90)
GFR calc non Af Amer: >90 mL/min (ref >90)
Glucose, Bld: 123 mg/dL — ABNORMAL HIGH (ref 70–99)
Potassium: 4.1 mmol/L (ref 3.5–5.1)
Sodium: 131 mmol/L — ABNORMAL LOW (ref 135–145)
Total Bilirubin: 2.1 mg/dL — ABNORMAL HIGH (ref 0.3–1.2)
Total Protein: 8.1 g/dL (ref 6.0–8.3)

## 2014-06-02 LAB — RETICULOCYTES
RBC.: 2.79 MIL/uL — ABNORMAL LOW (ref 4.22–5.81)
Retic Count, Absolute: 281.8 10*3/uL — ABNORMAL HIGH (ref 19.0–186.0)
Retic Ct Pct: 10.1 % — ABNORMAL HIGH (ref 0.4–3.1)

## 2014-06-02 LAB — LACTIC ACID, PLASMA
Lactic Acid, Venous: 0.7 mmol/L (ref 0.5–2.0)
Lactic Acid, Venous: 0.8 mmol/L (ref 0.5–2.0)

## 2014-06-02 LAB — CBC WITH DIFFERENTIAL/PLATELET
Basophils Absolute: 0 10*3/uL (ref 0.0–0.1)
Basophils Relative: 0 % (ref 0–1)
Eosinophils Absolute: 0 10*3/uL (ref 0.0–0.7)
Eosinophils Relative: 0 % (ref 0–5)
HEMATOCRIT: 26 % — AB (ref 39.0–52.0)
Hemoglobin: 8.8 g/dL — ABNORMAL LOW (ref 13.0–17.0)
LYMPHS ABS: 2 10*3/uL (ref 0.7–4.0)
Lymphocytes Relative: 9 % — ABNORMAL LOW (ref 12–46)
MCH: 31.5 pg (ref 26.0–34.0)
MCHC: 33.8 g/dL (ref 30.0–36.0)
MCV: 93.2 fL (ref 78.0–100.0)
MONOS PCT: 10 % (ref 3–12)
Monocytes Absolute: 2.2 10*3/uL — ABNORMAL HIGH (ref 0.1–1.0)
NEUTROS ABS: 18 10*3/uL — AB (ref 1.7–7.7)
Neutrophils Relative %: 81 % — ABNORMAL HIGH (ref 43–77)
Platelets: 867 10*3/uL — ABNORMAL HIGH (ref 150–400)
RBC: 2.79 MIL/uL — ABNORMAL LOW (ref 4.22–5.81)
RDW: 21.1 % — AB (ref 11.5–15.5)
WBC: 22.2 10*3/uL — ABNORMAL HIGH (ref 4.0–10.5)

## 2014-06-02 LAB — PROCALCITONIN: PROCALCITONIN: 0.12 ng/mL

## 2014-06-02 LAB — I-STAT TROPONIN, ED: Troponin i, poc: 0 ng/mL (ref 0.00–0.08)

## 2014-06-02 LAB — STREP PNEUMONIAE URINARY ANTIGEN: STREP PNEUMO URINARY ANTIGEN: NEGATIVE

## 2014-06-02 LAB — INFLUENZA PANEL BY PCR (TYPE A & B)
H1N1FLUPCR: NOT DETECTED
INFLBPCR: NEGATIVE
Influenza A By PCR: NEGATIVE

## 2014-06-02 LAB — I-STAT CG4 LACTIC ACID, ED: Lactic Acid, Venous: 1.08 mmol/L (ref 0.5–2.0)

## 2014-06-02 MED ORDER — VANCOMYCIN HCL IN DEXTROSE 1-5 GM/200ML-% IV SOLN
1000.0000 mg | Freq: Three times a day (TID) | INTRAVENOUS | Status: DC
  Administered 2014-06-02 – 2014-06-03 (×3): 1000 mg via INTRAVENOUS
  Filled 2014-06-02 (×5): qty 200

## 2014-06-02 MED ORDER — VANCOMYCIN HCL IN DEXTROSE 1-5 GM/200ML-% IV SOLN
1000.0000 mg | Freq: Once | INTRAVENOUS | Status: AC
  Administered 2014-06-02: 1000 mg via INTRAVENOUS
  Filled 2014-06-02: qty 200

## 2014-06-02 MED ORDER — ALBUTEROL SULFATE (2.5 MG/3ML) 0.083% IN NEBU
2.5000 mg | INHALATION_SOLUTION | Freq: Once | RESPIRATORY_TRACT | Status: AC
  Administered 2014-06-02: 2.5 mg via RESPIRATORY_TRACT
  Filled 2014-06-02: qty 3

## 2014-06-02 MED ORDER — OXYCODONE-ACETAMINOPHEN 5-325 MG PO TABS
1.0000 | ORAL_TABLET | Freq: Four times a day (QID) | ORAL | Status: DC | PRN
  Administered 2014-06-02 (×2): 1 via ORAL
  Filled 2014-06-02 (×2): qty 1

## 2014-06-02 MED ORDER — OXYCODONE-ACETAMINOPHEN 10-325 MG PO TABS: 1.0000 | ORAL_TABLET | Freq: Four times a day (QID) | ORAL | Status: DC | PRN

## 2014-06-02 MED ORDER — MORPHINE SULFATE 4 MG/ML IJ SOLN
8.0000 mg | Freq: Once | INTRAMUSCULAR | Status: AC
Start: 2014-06-02 — End: 2014-06-02
  Administered 2014-06-02: 8 mg via INTRAVENOUS
  Filled 2014-06-02: qty 2

## 2014-06-02 MED ORDER — ONDANSETRON HCL 4 MG/2ML IJ SOLN: 4.0000 mg | Freq: Four times a day (QID) | INTRAMUSCULAR | Status: DC | PRN

## 2014-06-02 MED ORDER — SODIUM CHLORIDE 0.9 % IV SOLN
INTRAVENOUS | Status: AC
  Administered 2014-06-02: 15:00:00 via INTRAVENOUS

## 2014-06-02 MED ORDER — MORPHINE SULFATE (PF) 1 MG/ML IV SOLN
INTRAVENOUS | Status: DC
  Administered 2014-06-02: 23:00:00 via INTRAVENOUS
  Administered 2014-06-03: 6 mg via INTRAVENOUS
  Administered 2014-06-03: 1 mg via INTRAVENOUS
  Administered 2014-06-03 – 2014-06-04 (×5): 4.5 mg via INTRAVENOUS
  Administered 2014-06-04: 1.5 mg via INTRAVENOUS
  Administered 2014-06-04: 3 mg via INTRAVENOUS
  Administered 2014-06-04: 4.5 mg via INTRAVENOUS
  Administered 2014-06-04: 21:00:00 via INTRAVENOUS
  Administered 2014-06-04: 3 mg via INTRAVENOUS
  Administered 2014-06-04: 4.5 mg via INTRAVENOUS
  Administered 2014-06-05: 5.83 mg via INTRAVENOUS
  Administered 2014-06-05: 1.5 mg via INTRAVENOUS
  Filled 2014-06-02 (×3): qty 25

## 2014-06-02 MED ORDER — HYDROMORPHONE 0.3 MG/ML IV SOLN: INTRAVENOUS | Status: DC

## 2014-06-02 MED ORDER — HYDROXYUREA 500 MG PO CAPS
1500.0000 mg | ORAL_CAPSULE | Freq: Every day | ORAL | Status: DC
  Administered 2014-06-02 – 2014-06-07 (×6): 1500 mg via ORAL
  Filled 2014-06-02 (×9): qty 3

## 2014-06-02 MED ORDER — DEXTROSE 5 % IV SOLN
1.0000 g | Freq: Three times a day (TID) | INTRAVENOUS | Status: DC
  Administered 2014-06-02 – 2014-06-06 (×11): 1 g via INTRAVENOUS
  Filled 2014-06-02 (×12): qty 1

## 2014-06-02 MED ORDER — DIPHENHYDRAMINE HCL 50 MG/ML IJ SOLN: 12.5000 mg | Freq: Four times a day (QID) | INTRAMUSCULAR | Status: DC | PRN

## 2014-06-02 MED ORDER — SODIUM CHLORIDE 0.9 % IV SOLN
INTRAVENOUS | Status: DC
  Administered 2014-06-02 – 2014-06-04 (×2): via INTRAVENOUS
  Administered 2014-06-04: 75 mL/h via INTRAVENOUS

## 2014-06-02 MED ORDER — ACETAMINOPHEN 650 MG RE SUPP: 650.0000 mg | Freq: Four times a day (QID) | RECTAL | Status: DC | PRN

## 2014-06-02 MED ORDER — ONDANSETRON HCL 4 MG PO TABS: 4.0000 mg | ORAL_TABLET | Freq: Four times a day (QID) | ORAL | Status: DC | PRN

## 2014-06-02 MED ORDER — NALOXONE HCL 0.4 MG/ML IJ SOLN: 0.4000 mg | INTRAMUSCULAR | Status: DC | PRN

## 2014-06-02 MED ORDER — CALCIUM CARBONATE ANTACID 500 MG PO CHEW: 1500.0000 mg | CHEWABLE_TABLET | Freq: Every day | ORAL | Status: DC | PRN

## 2014-06-02 MED ORDER — ACETAMINOPHEN 500 MG PO TABS: 1000.0000 mg | ORAL_TABLET | Freq: Once | ORAL | Status: DC

## 2014-06-02 MED ORDER — DIPHENHYDRAMINE HCL 12.5 MG/5ML PO ELIX: 12.5000 mg | ORAL_SOLUTION | Freq: Four times a day (QID) | ORAL | Status: DC | PRN

## 2014-06-02 MED ORDER — SODIUM CHLORIDE 0.9 % IV BOLUS (SEPSIS)
1000.0000 mL | Freq: Once | INTRAVENOUS | Status: AC
  Administered 2014-06-02: 1000 mL via INTRAVENOUS

## 2014-06-02 MED ORDER — SODIUM CHLORIDE 0.9 % IJ SOLN: 9.0000 mL | INTRAMUSCULAR | Status: DC | PRN

## 2014-06-02 MED ORDER — VANCOMYCIN HCL IN DEXTROSE 1-5 GM/200ML-% IV SOLN
1000.0000 mg | Freq: Three times a day (TID) | INTRAVENOUS | Status: DC
  Filled 2014-06-02: qty 200

## 2014-06-02 MED ORDER — OXYCODONE HCL 5 MG PO TABS
5.0000 mg | ORAL_TABLET | Freq: Four times a day (QID) | ORAL | Status: DC | PRN
  Administered 2014-06-02 (×2): 5 mg via ORAL
  Filled 2014-06-02 (×2): qty 1

## 2014-06-02 MED ORDER — PIPERACILLIN-TAZOBACTAM 3.375 G IVPB: 3.3750 g | Freq: Three times a day (TID) | INTRAVENOUS | Status: DC

## 2014-06-02 MED ORDER — SODIUM CHLORIDE 0.9 % IJ SOLN: 3.0000 mL | Freq: Two times a day (BID) | INTRAMUSCULAR | Status: DC

## 2014-06-02 MED ORDER — CHLORHEXIDINE GLUCONATE 0.12 % MT SOLN
15.0000 mL | Freq: Two times a day (BID) | OROMUCOSAL | Status: DC
  Administered 2014-06-03 – 2014-06-07 (×7): 15 mL via OROMUCOSAL
  Filled 2014-06-02 (×8): qty 15

## 2014-06-02 MED ORDER — PIPERACILLIN-TAZOBACTAM 3.375 G IVPB 30 MIN: 3.3750 g | Freq: Once | INTRAVENOUS | Status: DC

## 2014-06-02 MED ORDER — ACETAMINOPHEN 325 MG PO TABS
650.0000 mg | ORAL_TABLET | Freq: Four times a day (QID) | ORAL | Status: DC | PRN
  Administered 2014-06-02 – 2014-06-05 (×3): 650 mg via ORAL
  Filled 2014-06-02 (×3): qty 2

## 2014-06-02 MED ORDER — HYDROCODONE-ACETAMINOPHEN 5-325 MG PO TABS
2.0000 | ORAL_TABLET | Freq: Once | ORAL | Status: AC
  Administered 2014-06-02: 2 via ORAL
  Filled 2014-06-02: qty 2

## 2014-06-02 MED ORDER — CETYLPYRIDINIUM CHLORIDE 0.05 % MT LIQD
7.0000 mL | Freq: Two times a day (BID) | OROMUCOSAL | Status: DC
  Administered 2014-06-03 – 2014-06-06 (×7): 7 mL via OROMUCOSAL

## 2014-06-02 MED ORDER — IPRATROPIUM-ALBUTEROL 0.5-2.5 (3) MG/3ML IN SOLN
3.0000 mL | RESPIRATORY_TRACT | Status: DC | PRN
  Administered 2014-06-02 – 2014-06-03 (×3): 3 mL via RESPIRATORY_TRACT
  Filled 2014-06-02 (×3): qty 3

## 2014-06-02 MED ORDER — DEXTROSE 5 % IV SOLN
2.0000 g | Freq: Once | INTRAVENOUS | Status: AC
  Administered 2014-06-02: 2 g via INTRAVENOUS
  Filled 2014-06-02: qty 2

## 2014-06-02 NOTE — Progress Notes (Addendum)
ANTIBIOTIC CONSULT NOTE - INITIAL  Pharmacy Consult for Vancomycin/Cefepime Indication: HCAP  No Known Allergies  Patient Measurements:    Vital Signs: Temp: 101.4 F (38.6 C) (04/15 1058) Temp Source: Oral (04/15 1058) BP: 118/68 mmHg (04/15 1200) Pulse Rate: 114 (04/15 1200) Intake/Output from previous day:   Intake/Output from this shift:    Labs:  Recent Labs  06/02/14 1112  WBC 22.2*  HGB 8.8*  PLT 867*  CREATININE 0.84   Estimated Creatinine Clearance: 127.9 mL/min (by C-G formula based on Cr of 0.84). No results for input(s): VANCOTROUGH, VANCOPEAK, VANCORANDOM, GENTTROUGH, GENTPEAK, GENTRANDOM, TOBRATROUGH, TOBRAPEAK, TOBRARND, AMIKACINPEAK, AMIKACINTROU, AMIKACIN in the last 72 hours.   Microbiology: Recent Results (from the past 720 hour(s))  Culture, blood (routine x 2) Call MD if unable to obtain prior to antibiotics being given     Status: None   Collection Time: 05/23/14  5:15 PM  Result Value Ref Range Status   Specimen Description BLOOD LEFT HAND  Final   Special Requests BOTTLES DRAWN AEROBIC ONLY 10CC  Final   Culture   Final    NO GROWTH 5 DAYS Performed at [facility]    Report Status 05/30/2014 FINAL  Final  Culture, blood (routine x 2) Call MD if unable to obtain prior to antibiotics being given     Status: None   Collection Time: 05/23/14  5:40 PM  Result Value Ref Range Status   Specimen Description BLOOD LEFT ARM  Final   Special Requests BOTTLES DRAWN AEROBIC AND ANAEROBIC 10CC  Final   Culture   Final    NO GROWTH 5 DAYS Performed at [facility]    Report Status 05/30/2014 FINAL  Final  Culture, sputum-assessment     Status: None   Collection Time: 05/23/14  7:31 PM  Result Value Ref Range Status   Specimen Description SPUTUM  Final   Special Requests Normal  Final   Sputum evaluation   Final    THIS SPECIMEN IS ACCEPTABLE. RESPIRATORY CULTURE REPORT TO FOLLOW.   Report Status 05/23/2014 FINAL  Final   Culture, respiratory (NON-Expectorated)     Status: None   Collection Time: 05/23/14  7:31 PM  Result Value Ref Range Status   Specimen Description SPUTUM  Final   Special Requests NONE  Final   Gram Stain   Final    FEW WBC PRESENT, PREDOMINANTLY PMN RARE SQUAMOUS EPITHELIAL CELLS PRESENT FEW GRAM POSITIVE COCCI IN PAIRS RARE GRAM POSITIVE RODS Performed at [facility]    Culture   Final    NORMAL OROPHARYNGEAL FLORA Performed at [facility]    Report Status 05/26/2014 FINAL  Final    Medical History: Past Medical History  Diagnosis Date  . Asthma   . Sickle cell disease     Medications:  Scheduled:  . acetaminophen  1,000 mg Oral Once  . HYDROmorphone PCA 0.3 mg/mL   Intravenous 6 times per day   Infusions:  . ceFEPime (MAXIPIME) IV    . ceFEPime (MAXIPIME) IV    . vancomycin    . vancomycin     PRN: diphenhydrAMINE **OR** diphenhydrAMINE, naloxone **AND** sodium chloride, ondansetron (ZOFRAN) IV   Assessment: 19yoM w/ sickle cell anemia presenting with SOB, fever, cough. Recently admitted for acute chest and head CT angio that showed no PE. Went home several days ago and for the last 2 days had progressive right-sided rib and chest pain.  Patchy infiltrate in RLL suspicious for recurrent PNA on  CXR.  Initially ordered vancomycin/Zosyn; pharmacy to dose vancomycin/cefepime for HCAP.  4/15 >> Cefepime >> 4/15 >> Vancomycin >>    Tmax: 101.4 WBCs: 22.2 Renal: SCr wnl; CrCl > 100 CG  4/15 blood: IP 4/15 sputum: IP 4/15 influenza panel 4/15 Legionella/Strep UAg: IP 4/15 Lactate: wnl 4/15 PCT 4/15 HIV Ab  Drug level / dose changes info:  4/15: D1 vancomycin 1g q8 and cefepime 2g x 1 then 1g q8 for HCAP.  Recent acute chest admission  LN: 4/15   Goal of Therapy:   Vancomycin trough level 15-20 mcg/ml   Eradication of infection  Appropriate antibiotic dosing for indication and renal function  Plan:  Day 1 of  antibiotics  Cefepime 2g IV x 1, then 1g IV q8 hr Vancomycin 1000 mg IV q8 hr Vancomycin trough ordered before 5th dose on 4/16.  Follow clinical course, renal function, culture results as available  Follow for de-escalation of antibiotics and LOT   Reuel Boom, PharmD Pager: [phone] 06/02/2014, 12:51 PM

## 2014-06-02 NOTE — ED Notes (Signed)
Per pt, states he was in hospital for PNA-states SOB for a few days-still taking antibiotics-saw PCp on Wed but did not tell him he had SOB

## 2014-06-02 NOTE — ED Provider Notes (Signed)
CSN: [id number]     Arrival date & time 06/02/14  1045 History   First MD Initiated Contact with Patient 06/02/14 1102     Chief Complaint  Patient presents with  . Shortness of Breath     (Consider location/radiation/quality/duration/timing/severity/associated sxs/prior Treatment) The history is provided by the patient.  Jason Franklin is a 19 y.o. male history of sickle cell anemia here presenting with shortness of breath, fever, cough. Recently admitted for acute chest and head CT angio that showed no PE. He went home several days ago and for the last 2 days had progressive right-sided rib and chest pain. Also has worsening shortness of breath and cough and fever. Denies any vomiting or abdominal pain. Has some right upper back pain as well.   Past Medical History  Diagnosis Date  . Asthma   . Sickle cell disease    Past Surgical History  Procedure Laterality Date  . Splenectomy     Family History  Problem Relation Age of Onset  . Sickle cell trait Mother   . Sickle cell trait Father   . Diabetes Mellitus II Paternal Grandmother    History  Substance Use Topics  . Smoking status: Never Smoker   . Smokeless tobacco: Not on file  . Alcohol Use: No    Review of Systems  Respiratory: Positive for shortness of breath.   Cardiovascular: Positive for chest pain.  All other systems reviewed and are negative.     Allergies  Review of patient's allergies indicates no known allergies.  Home Medications   Prior to Admission medications   Medication Sig Start Date End Date Taking? Authorizing Provider  albuterol (PROVENTIL HFA;VENTOLIN HFA) 108 (90 BASE) MCG/ACT inhaler Inhale 2 puffs into the lungs every 4 (four) hours as needed for wheezing or shortness of breath. 05/23/14  Yes Leana Gamer, MD  calcium carbonate (TUMS EX) 750 MG chewable tablet Chew 2 tablets by mouth daily as needed for heartburn.   Yes Historical Provider, MD  hydroxyurea (HYDREA) 500 MG capsule  Take 1,500 mg by mouth daily. May take with food to minimize GI side effects.   Yes Historical Provider, MD  levofloxacin (LEVAQUIN) 750 MG tablet Take 1 tablet (750 mg total) by mouth daily. Patient taking differently: Take 750 mg by mouth daily. For 6 days 05/24/14  Yes Leana Gamer, MD  oxyCODONE-acetaminophen (PERCOCET) 10-325 MG per tablet Take 1 tablet by mouth every 6 (six) hours as needed for pain. 05/23/14  Yes Leana Gamer, MD  folic acid (FOLVITE) 1 MG tablet Take 1 tablet (1 mg total) by mouth daily. Patient not taking: Reported on 06/02/2014 05/23/14   Leana Gamer, MD  ondansetron (ZOFRAN ODT) 4 MG disintegrating tablet Take 1 tablet (4 mg total) by mouth every 8 (eight) hours as needed for nausea. Patient not taking: Reported on 05/20/2014 03/01/14   [name], MD   BP 118/68 mmHg  Pulse 114  Temp(Src) 101.4 F (38.6 C) (Oral)  Resp 32  SpO2 98% Physical Exam  Constitutional: He is oriented to person, place, and time.  Uncomfortable,   HENT:  Head: Normocephalic.  Mouth/Throat: Oropharynx is clear and moist.  Eyes: Conjunctivae are normal. Pupils are equal, round, and reactive to light.  Neck: Normal range of motion. Neck supple.  Cardiovascular: Regular rhythm.   Tachycardic   Pulmonary/Chest:  Crackles R base   Abdominal: Soft. Bowel sounds are normal. He exhibits no distension. There is no tenderness. There is no  rebound.  Musculoskeletal: Normal range of motion. He exhibits no edema or tenderness.  Neurological: He is alert and oriented to person, place, and time. No cranial nerve deficit. Coordination normal.  Skin: Skin is warm and dry.  Psychiatric: He has a normal mood and affect. His behavior is normal. Judgment and thought content normal.  Nursing note and vitals reviewed.   ED Course  Procedures (including critical care time) Labs Review Labs Reviewed  CBC WITH DIFFERENTIAL/PLATELET - Abnormal; Notable for the following:    WBC 22.2  (*)    RBC 2.79 (*)    Hemoglobin 8.8 (*)    HCT 26.0 (*)    RDW 21.1 (*)    Platelets 867 (*)    All other components within normal limits  COMPREHENSIVE METABOLIC PANEL - Abnormal; Notable for the following:    Sodium 131 (*)    Glucose, Bld 123 (*)    Total Bilirubin 2.1 (*)    All other components within normal limits  RETICULOCYTES - Abnormal; Notable for the following:    Retic Ct Pct 10.1 (*)    RBC. 2.79 (*)    Retic Count, Manual 281.8 (*)    All other components within normal limits  CULTURE, BLOOD (ROUTINE X 2)  CULTURE, BLOOD (ROUTINE X 2)  INFLUENZA PANEL BY PCR (TYPE A & B, H1N1)  I-STAT CG4 LACTIC ACID, ED  Randolm Idol, ED    Imaging Review Dg Chest Port 1 View  06/02/2014   CLINICAL DATA:  Right side chest pain, recent pneumonia  EXAM: PORTABLE CHEST - 1 VIEW  COMPARISON:  05/23/2014  FINDINGS: There is patchy infiltrate in right lower lobe suspicious for recurrent pneumonia. No pulmonary edema.  IMPRESSION: Patchy infiltrate in right lower lobe suspicious for recurrent pneumonia. No pulmonary edema.   Electronically Signed   By: Lahoma Crocker M.D.   On: 06/02/2014 12:07     EKG Interpretation   Date/Time:  Friday June 02 2014 10:57:46 EDT Ventricular Rate:  116 PR Interval:  154 QRS Duration: 86 QT Interval:  300 QTC Calculation: 417 R Axis:   69 Text Interpretation:  Sinus tachycardia LVH by voltage Since last tracing  rate faster Confirmed by YAO  MD, DAVID ([id number]) on 06/02/2014 11:28:04 AM      MDM   Final diagnoses:  Cough   Jason Franklin is a 19 y.o. male here with fever, cough, chest pain. Likely acute chest vs flu vs pneumonia. Just had CT angio last week. Will do sepsis workup. Will likely admit.   12:20 PM CXR showed recurrent pneumonia. WBC 22 now. Hg baseline. Given vanc/zosyn for HCAP. Flu sent. Less tachypneic now, still tachy. Will admit.     Wandra Arthurs, MD 06/02/14 [phone]

## 2014-06-02 NOTE — ED Notes (Signed)
Pt requesting IV team for access. Will f/u with pt for stick, first attempt unsuccessful

## 2014-06-02 NOTE — H&P (Signed)
[facility] History and Physical  Jason Franklin DZH:[id number] DOB: [birth year] DOA: 06/02/2014  Referring physician: ER physician PCP: Benito Mccreedy, MD   Chief Complaint:  Shortness of breath, fever  HPI:  19 -year-old male with past medical history of sickle cell disease and related anemia, recent admission for community-acquired pneumonia. He presented to [facility] long hospital with reports of worsening shortness of breath, fever and cough over past few days prior to this admission. Patient also reported some chest pain with coughing. No reports of palpitations. He is pain is mostly on the right side of the rib cage, 8 out of 10 in intensity when it hurts, intermittent, nonradiating. Patient reported back pain as well. Pain was not relieved with analgesia at home. No reports of abdominal pain, nausea or vomiting. No lightheadedness. No reports of diarrhea or constipation. No blood in the stool or urine. No urinary complaints.  In ED, blood pressure was 118/68, heart rate 114, RR 32-46, T max 101.4 F and oxygen saturation 95% on room air. Blood work was significant for white blood cell count of 22.2, hemoglobin 8.8. Chest x-ray showed patchy infiltrate in right lower lobe suspicious for recurrent pneumonia. Patient was started on broad-spectrum antibiotics, vancomycin and Zosyn for treatment of healthcare associated pneumonia.  Assessment & Plan    Principal Problem:   Sepsis secondary to healthcare associated pneumonia / lobar pneumonia / leukocytosis - Sepsis criteria met on the admission with fever, tachycardia, tachypnea in addition to leukocytosis. Sepsis order set placed. Source of infection pneumonia as is seen on chest x-ray. - Patient started on broad-spectrum antibiotics, vancomycin and Zosyn. - Follow-up blood culture results, lactic acid, procalcitonin. - Because source of infection is pneumonia, pneumonia order set placed as well. Follow-up HIV, influenza, strep  pneumonia and legionellaresults. - Droplet precautions. - Continue IV fluids. - Admission to telemetry.  Active Problems:   Sickle cell pain crisis - Resume home pain medications. Start Dilaudid PCA for pain control.    Sickle cell anemia - Hemoglobin is 8.8 on the admission. Stable. - No reports of bleeding. No current indications for transfusion.    DVT prophylaxis:  - SCDs bilaterally  Radiological Exams on Admission: Dg Chest Port 1 View  06/02/2014   CLINICAL DATA:  Right side chest pain, recent pneumonia  EXAM: PORTABLE CHEST - 1 VIEW  COMPARISON:  05/23/2014  FINDINGS: There is patchy infiltrate in right lower lobe suspicious for recurrent pneumonia. No pulmonary edema.  IMPRESSION: Patchy infiltrate in right lower lobe suspicious for recurrent pneumonia. No pulmonary edema.   Electronically Signed   By: Lahoma Crocker M.D.   On: 06/02/2014 12:07    EKG: sinus tachycardia  Code Status: Full Family Communication: Plan of care discussed with the patient  Disposition Plan: Admit for further evaluation; telemetry admission   Leisa Lenz, MD  [facility] Pager [phone]  Time spent in minutes: 75 minutes  Review of Systems:  Constitutional: Negative for fever, chills and malaise/fatigue. Negative for diaphoresis.  HENT: Negative for hearing loss, ear pain, nosebleeds, congestion, sore throat, neck pain, tinnitus and ear discharge.   Eyes: Negative for blurred vision, double vision, photophobia, pain, discharge and redness.  Respiratory:  Per history of present illness   Cardiovascular: Negative for chest pain, palpitations, orthopnea, claudication and leg swelling.  Gastrointestinal: Negative for nausea, vomiting and abdominal pain. Negative for heartburn, constipation, blood in stool and melena.  Genitourinary: Negative for dysuria, urgency, frequency, hematuria and flank pain.  Musculoskeletal: Negative for myalgias, back  pain, joint pain and falls.  Skin: Negative  for itching and rash.  Neurological: Negative for dizziness and weakness. Negative for tingling, tremors, sensory change, speech change, focal weakness, loss of consciousness and headaches.  Endo/Heme/Allergies: Negative for environmental allergies and polydipsia. Does not bruise/bleed easily.  Psychiatric/Behavioral: Negative for suicidal ideas. The patient is not nervous/anxious.      Past Medical History  Diagnosis Date  . Asthma   . Sickle cell disease    Past Surgical History  Procedure Laterality Date  . Splenectomy     Social History:  reports that he has never smoked. He does not have any smokeless tobacco history on file. He reports that he does not drink alcohol or use illicit drugs.  No Known Allergies  Family History:  Family History  Problem Relation Age of Onset  . Sickle cell trait Mother   . Sickle cell trait Father   . Diabetes Mellitus II Paternal Grandmother      Prior to Admission medications   Medication Sig Start Date End Date Taking? Authorizing Provider  albuterol (PROVENTIL HFA;VENTOLIN HFA) 108 (90 BASE) MCG/ACT inhaler Inhale 2 puffs into the lungs every 4 (four) hours as needed for wheezing or shortness of breath. 05/23/14  Yes Leana Gamer, MD  calcium carbonate (TUMS EX) 750 MG chewable tablet Chew 2 tablets by mouth daily as needed for heartburn.   Yes Historical Provider, MD  hydroxyurea (HYDREA) 500 MG capsule Take 1,500 mg by mouth daily. May take with food to minimize GI side effects.   Yes Historical Provider, MD  levofloxacin (LEVAQUIN) 750 MG tablet Take 1 tablet (750 mg total) by mouth daily. Patient taking differently: Take 750 mg by mouth daily. For 6 days 05/24/14  Yes Leana Gamer, MD  oxyCODONE-acetaminophen (PERCOCET) 10-325 MG per tablet Take 1 tablet by mouth every 6 (six) hours as needed for pain. 05/23/14  Yes Leana Gamer, MD  folic acid (FOLVITE) 1 MG tablet Take 1 tablet (1 mg total) by mouth daily. Patient not  taking: Reported on 06/02/2014 05/23/14   Leana Gamer, MD  ondansetron (ZOFRAN ODT) 4 MG disintegrating tablet Take 1 tablet (4 mg total) by mouth every 8 (eight) hours as needed for nausea. Patient not taking: Reported on 05/20/2014 03/01/14   [name], MD   Physical Exam: Filed Vitals:   06/02/14 1058 06/02/14 1130 06/02/14 1200  BP: 125/63 130/72 118/68  Pulse: 114 118 114  Temp: 101.4 F (38.6 C)    TempSrc: Oral    Resp: 46 34 32  SpO2: 95% 97% 98%    Physical Exam  Constitutional: Appears well-developed and well-nourished. No distress.  HENT: Normocephalic. No tonsillar erythema or exudates Eyes: Conjunctivae and EOM are normal. PERRLA, no scleral icterus.  Neck: Normal ROM. Neck supple. No JVD. No tracheal deviation. No thyromegaly.  CVS: RRR, S1/S2 +, no murmurs, no gallops, no carotid bruit.  Pulmonary:  Diminished breath sounds, no wheezing, no crackles  Abdominal: Soft. BS +,  no distension, tenderness, rebound or guarding.  Musculoskeletal: Normal range of motion. No edema and no tenderness.  Lymphadenopathy: No lymphadenopathy noted, cervical, inguinal. Neuro: Alert. Normal reflexes, muscle tone coordination. No focal neurologic deficits. Skin: Skin is warm and dry. No rash noted.  No erythema. No pallor.  Psychiatric: Normal mood and affect. Behavior, judgment, thought content normal.   Labs on Admission:  Basic Metabolic Panel:  Recent Labs Lab 06/02/14 1112  NA 131*  K 4.1  CL 97  CO2 24  GLUCOSE 123*  BUN 9  CREATININE 0.84  CALCIUM 9.3   Liver Function Tests:  Recent Labs Lab 06/02/14 1112  AST 18  ALT 15  ALKPHOS 76  BILITOT 2.1*  PROT 8.1  ALBUMIN 4.2   No results for input(s): LIPASE, AMYLASE in the last 168 hours. No results for input(s): AMMONIA in the last 168 hours. CBC:  Recent Labs Lab 06/02/14 1112  WBC 22.2*  NEUTROABS PENDING  HGB 8.8*  HCT 26.0*  MCV 93.2  PLT 867*   Cardiac Enzymes: No results for  input(s): CKTOTAL, CKMB, CKMBINDEX, TROPONINI in the last 168 hours. BNP: Invalid input(s): POCBNP CBG: No results for input(s): GLUCAP in the last 168 hours.  If 7PM-7AM, please contact night-coverage [email or web address] Password [facility] 06/02/2014, 12:36 PM

## 2014-06-03 DIAGNOSIS — J189 Pneumonia, unspecified organism: Secondary | ICD-10-CM

## 2014-06-03 LAB — CBC
HEMATOCRIT: 22.8 % — AB (ref 39.0–52.0)
Hemoglobin: 7.6 g/dL — ABNORMAL LOW (ref 13.0–17.0)
MCH: 31.5 pg (ref 26.0–34.0)
MCHC: 33.3 g/dL (ref 30.0–36.0)
MCV: 94.6 fL (ref 78.0–100.0)
PLATELETS: 865 10*3/uL — AB (ref 150–400)
RBC: 2.41 MIL/uL — AB (ref 4.22–5.81)
RDW: 21.3 % — AB (ref 11.5–15.5)
WBC: 24.2 10*3/uL — ABNORMAL HIGH (ref 4.0–10.5)

## 2014-06-03 LAB — COMPREHENSIVE METABOLIC PANEL
ALT: 12 U/L (ref 0–53)
AST: 15 U/L (ref 0–37)
Albumin: 3.3 g/dL — ABNORMAL LOW (ref 3.5–5.2)
Alkaline Phosphatase: 62 U/L (ref 39–117)
Anion gap: 6 (ref 5–15)
BUN: 6 mg/dL (ref 6–23)
CHLORIDE: 102 mmol/L (ref 96–112)
CO2: 27 mmol/L (ref 19–32)
Calcium: 8.6 mg/dL (ref 8.4–10.5)
Creatinine, Ser: 0.74 mg/dL (ref 0.50–1.35)
GFR calc Af Amer: >90 mL/min (ref >90)
GFR calc non Af Amer: >90 mL/min (ref >90)
GLUCOSE: 120 mg/dL — AB (ref 70–99)
Potassium: 4.3 mmol/L (ref 3.5–5.1)
SODIUM: 135 mmol/L (ref 135–145)
TOTAL PROTEIN: 6.6 g/dL (ref 6.0–8.3)
Total Bilirubin: 1.2 mg/dL (ref 0.3–1.2)

## 2014-06-03 LAB — GLUCOSE, CAPILLARY: Glucose-Capillary: 112 mg/dL — ABNORMAL HIGH (ref 70–99)

## 2014-06-03 LAB — VANCOMYCIN, TROUGH: VANCOMYCIN TR: 9.5 ug/mL — AB (ref 10.0–20.0)

## 2014-06-03 LAB — HIV ANTIBODY (ROUTINE TESTING W REFLEX): HIV SCREEN 4TH GENERATION: NONREACTIVE

## 2014-06-03 MED ORDER — VANCOMYCIN HCL 10 G IV SOLR
1250.0000 mg | Freq: Three times a day (TID) | INTRAVENOUS | Status: DC
  Administered 2014-06-03 – 2014-06-04 (×4): 1250 mg via INTRAVENOUS
  Filled 2014-06-03 (×6): qty 1250

## 2014-06-03 MED ORDER — IPRATROPIUM-ALBUTEROL 0.5-2.5 (3) MG/3ML IN SOLN
3.0000 mL | Freq: Two times a day (BID) | RESPIRATORY_TRACT | Status: DC
  Administered 2014-06-04 – 2014-06-06 (×5): 3 mL via RESPIRATORY_TRACT
  Filled 2014-06-03 (×4): qty 3

## 2014-06-03 MED ORDER — HYDROCODONE-ACETAMINOPHEN 5-325 MG PO TABS
1.0000 | ORAL_TABLET | ORAL | Status: DC | PRN
  Administered 2014-06-06 – 2014-06-07 (×4): 1 via ORAL
  Filled 2014-06-03 (×5): qty 1

## 2014-06-03 NOTE — Progress Notes (Signed)
Subjective: Patient is a 19 year old gentleman with known history of sickle cell disease who is opiate nave presenting with fever up to 102 sepsis type feature with some sickle cell crisis. He was admitted with pneumonia most likely healthcare associated pneumonia since he just left the hospital. He has received IV vancomycin and cefepime and is on very low dose morphine PCA. Patient is still spiking fever at 102 today and has some shortness of breath but generally stable. He denied nausea or vomiting. His main complaint today is pain in his right chest area consistent with pleuritic chest pain. The pain is at 3 out of 10 and mainly associated with deep breath. He has so far used 19.5 mg of the morphine was 13 demands and 30 and deliveries. He was to try oral medications. Objective: Vital signs in last 24 hours: Temp:  [98.9 F (37.2 C)-102.9 F (39.4 C)] 102.7 F (39.3 C) (04/16 1526) Pulse Rate:  [98-115] 115 (04/16 1526) Resp:  [16-25] 16 (04/16 1526) BP: (106-137)/(49-54) 106/49 mmHg (04/16 1526) SpO2:  [93 %-100 %] 93 % (04/16 1526) Weight:  [134 lb 0.6 oz (60.8 kg)-136 lb 0.4 oz (61.7 kg)] 134 lb 0.6 oz (60.8 kg) (04/16 0700) Weight change:  Last BM Date: 05/31/14  Intake/Output from previous day: 04/15 0701 - 04/16 0700 In: 3425 [P.O.:720; I.V.:2705] Out: 2850 [Urine:2850] Intake/Output this shift: Total I/O In: -  Out: 600 [Urine:600]  General appearance: alert, cooperative and no distress Head: Normocephalic, without obvious abnormality, atraumatic Eyes: conjunctivae/corneas clear. PERRL, EOM's intact. Fundi benign. Back: symmetric, no curvature. ROM normal. No CVA tenderness. Resp: clear to auscultation bilaterally Chest wall: no tenderness Cardio: regular rate and rhythm, S1, S2 normal, no murmur, click, rub or gallop GI: soft, non-tender; bowel sounds normal; no masses,  no organomegaly Extremities: extremities normal, atraumatic, no cyanosis or edema Pulses: 2+ and  symmetric Skin: Skin color, texture, turgor normal. No rashes or lesions Neurologic: Grossly normal  Lab Results:  Recent Labs  06/02/14 1112 06/03/14 0518  WBC 22.2* 24.2*  HGB 8.8* 7.6*  HCT 26.0* 22.8*  PLT 867* 865*   BMET  Recent Labs  06/02/14 1112 06/03/14 0518  NA 131* 135  K 4.1 4.3  CL 97 102  CO2 24 27  GLUCOSE 123* 120*  BUN 9 6  CREATININE 0.84 0.74  CALCIUM 9.3 8.6    Studies/Results: Dg Chest Port 1 View  06/02/2014   CLINICAL DATA:  Right side chest pain, recent pneumonia  EXAM: PORTABLE CHEST - 1 VIEW  COMPARISON:  05/23/2014  FINDINGS: There is patchy infiltrate in right lower lobe suspicious for recurrent pneumonia. No pulmonary edema.  IMPRESSION: Patchy infiltrate in right lower lobe suspicious for recurrent pneumonia. No pulmonary edema.   Electronically Signed   By: Lahoma Crocker M.D.   On: 06/02/2014 12:07    Medications: I have reviewed the patient's current medications.  Assessment/Plan: A 19 year old gentleman with sickle cell painful crisis.  #1 sepsis syndrome: Due to healthcare associated pneumonia. Cultures are still pending but continue with IV antibiotics. Also supportive care.  #2 healthcare associated pneumonia: Continue IV antibiotics. Await culture results.  #3 sickle cell anemia: No evidence of active crisis. Patient's pain seemed to be related to pleuritic chest pain from the pneumonia. Continue current care. Will transfer back to try hospital is in the morning.  #4 leukocytosis: Most likely this is due to his pneumonia. Continue to monitor his white count.  #5 sickle cell care: Continue hydroxyurea.  LOS: 1 day   Segundo Makela,LAWAL 06/03/2014, 3:58 PM

## 2014-06-03 NOTE — Progress Notes (Signed)
ANTIBIOTIC CONSULT NOTE - Follow Up  Pharmacy Consult for Vancomycin/Cefepime Indication: HCAP  No Known Allergies  Patient Measurements: Height: 5\' 8"  (172.7 cm) Weight: 134 lb 0.6 oz (60.8 kg) IBW/kg (Calculated) : 68.4  Vital Signs: Temp: 98.9 F (37.2 C) (04/16 0504) Temp Source: Oral (04/16 0504) BP: 117/54 mmHg (04/16 0504) Pulse Rate: 98 (04/16 0504) Intake/Output from previous day: 04/15 0701 - 04/16 0700 In: 3425 [P.O.:720; I.V.:2705] Out: 2850 [Urine:2850] Intake/Output from this shift: Total I/O In: -  Out: 600 [Urine:600]  Labs:  Recent Labs  06/02/14 1112 06/03/14 0518  WBC 22.2* 24.2*  HGB 8.8* 7.6*  PLT 867* 865*  CREATININE 0.84 0.74   Estimated Creatinine Clearance: 125.6 mL/min (by C-G formula based on Cr of 0.74). No results for input(s): VANCOTROUGH, VANCOPEAK, VANCORANDOM, GENTTROUGH, GENTPEAK, GENTRANDOM, TOBRATROUGH, TOBRAPEAK, TOBRARND, AMIKACINPEAK, AMIKACINTROU, AMIKACIN in the last 72 hours.    Assessment: 19yoM w/ sickle cell anemia presenting with SOB, fever, cough. Recently admitted for acute chest and head CT angio that showed no PE. Went home several days ago and for the last 2 days had progressive right-sided rib and chest pain.  Patchy infiltrate in RLL suspicious for recurrent PNA on CXR.  Initially ordered vancomycin/Zosyn; pharmacy to dose vancomycin/cefepime for HCAP.  4/15 >> Cefepime >> 4/15 >> Vancomycin >>    Tmax: 102.9 WBCs: elevated Renal: SCr wnl; CrCl > 100 CG PCT 0.12  4/15 blood: ngtd 4/15 sputum:  4/15 influenza panel: neg 4/15 Legionella/Strep UAg: IP/neg 4/15 HIV: neg  Drug level / dose changes info:  Today is day #2 vancomycin 1g q8 and cefepime 2g x 1 then 1g q8 for HCAP. Recent acute chest admission.   Goal of Therapy:   Vancomycin trough level 15-20 mcg/ml   Eradication of infection  Appropriate antibiotic dosing for indication and renal function  Plan:  1.  Check vancomycin trough  tonight. 2.  Continue Cefepime 1g q8h. 3.  F/u culture results, clinical course.  Hershal Coria, PharmD, BCPS Pager: [phone] 06/03/2014 12:27 PM

## 2014-06-03 NOTE — Progress Notes (Signed)
Pharmacy: Re- vancomycin  Patient's a 19 y.o M on vancomycin for  HCAP.  Vancomycin trough level now back sub-therapeutic at 9.5 (goal 15-20).  Plan: - will increase dose to 1250mg  IV q8h - will check steady state level with new regimen  Dia Sitter, PharmD, BCPS 06/03/2014 9:37 PM

## 2014-06-03 NOTE — Progress Notes (Signed)
CARE MANAGEMENT NOTE 06/03/2014  Patient:  REGINALDO, HAZARD   Account Number:  [id number]  Date Initiated:  06/03/2014  Documentation initiated by:  DAVIS,RHONDA  Subjective/Objective Assessment:   pt admitted with sepsis and hx of ssc, having pain and fever     Action/Plan:   home when stable   Anticipated DC Date:  06/06/2014   Anticipated DC Plan:  HOME/SELF CARE  In-house referral  NA      DC Planning Services  CM consult      PAC Choice  NA   Choice offered to / List presented to:  NA      DME agency  NA        [place] agency  NA   Status of service:  In process, will continue to follow Medicare Important Message given?   (If response is "NO", the following Medicare IM given date fields will be blank) Date Medicare IM given:   Medicare IM given by:   Date Additional Medicare IM given:   Additional Medicare IM given by:    Discharge Disposition:    Per UR Regulation:  Reviewed for med. necessity/level of care/duration of stay  If discussed at [place] of Stay Meetings, dates discussed:    Comments:  June 03, 2014/Rhonda L. Rosana Hoes, RN, BSN, CCM. Case Management [place] [phone] No discharge needs present of time of review.

## 2014-06-04 DIAGNOSIS — D5700 Hb-SS disease with crisis, unspecified: Secondary | ICD-10-CM

## 2014-06-04 LAB — GLUCOSE, CAPILLARY: Glucose-Capillary: 97 mg/dL (ref 70–99)

## 2014-06-04 NOTE — Progress Notes (Signed)
Patient ID: Jason Franklin, male   DOB: 1995/03/27, 19 y.o.   MRN: [id number] [facility] PROGRESS NOTE  CHARLS CUSTER [id number]:[id number] DOB: [birth year] DOA: 2014/06/15 PCP: Benito Mccreedy, MD  Brief narrative:    35 -year-old male with past medical history of sickle cell disease and related anemia, recent admission for community-acquired pneumonia. He presented to [facility] hospital with reports of worsening shortness of breath, fever and cough over past few days prior to this admission. He was found to have patchy infiltrate in right lower lobe suspicious for recurrent pneumonia based on admission CXR. Started on vanco and cefepime.    Assessment/Plan:    Principal Problem: Sepsis secondary to healthcare associated pneumonia / lobar pneumonia / leukocytosis - Sepsis criteria met on the admission with fever, tachycardia, tachypnea in addition to leukocytosis. CXR revealed right lower lobe pneumonia. - Sepsis order set placed on admission. Lactic acid and procalcitonin level WNL. - Continue vancomycin and Zosyn. - Blood cultures so far negative, strep pneumonia neg, legionella pending - Influenza panel negative    Active Problems: Sickle cell pain crisis - Dilaudid PCA for pain control - Pain is 7/10 in intensity  Sickle cell anemia - Hemoglobin 8.8 on the admission. This am 7.6 - Continue to monitor daily CBC. - Continue hydroxyurea  Hyponatremia - Likely prerenal due to sepsis, pneumonia - Resolved with IV fluids     DVT prophylaxis:  - SCDs bilaterally   Code Status: Full.  Family Communication:  plan of care discussed with the patient Disposition Plan: on abx for HCAP, not stable for discharge   IV access:  Peripheral IV  Procedures and diagnostic studies:    Dg Chest Port 1 View 06-15-2014 Patchy infiltrate in right lower lobe suspicious for recurrent pneumonia. No pulmonary edema.   Electronically Signed   By: Lahoma Crocker M.D.   On: June 15, 2014 12:07    Medical Consultants:  None   Other Consultants:  None   IAnti-Infectives:   Vanco 15-Jun-2014 --> Cefepime 06/15/14 -->   Leisa Lenz, MD  [facility] Pager [phone]  Time spent in minutes: 25 minutes  If 7PM-7AM, please contact night-coverage [email or web address] Password [facility] 06/04/2014, 3:16 PM   LOS: 2 days    HPI/Subjective: No acute overnight events. Pain is 7/10 in intensity.  Objective: Filed Vitals:   06/04/14 0552 06/04/14 0800 06/04/14 0820 06/04/14 1200  BP: 125/68     Pulse: 98     Temp: 98.5 F (36.9 C)     TempSrc: Oral     Resp: '31 24  18  ' Height:      Weight: 61.78 kg (136 lb 3.2 oz)     SpO2: 100% 100% 100% 100%    Intake/Output Summary (Last 24 hours) at 06/04/14 1516 Last data filed at 06/04/14 1407  Gross per 24 hour  Intake   2565 ml  Output   2450 ml  Net    115 ml    Exam:   General:  Pt is alert, follows commands appropriately, not in acute distress  Cardiovascular: tachycardic, S1/S2, no murmurs  Respiratory: Clear to auscultation bilaterally, no wheezing, no crackles, no rhonchi  Abdomen: Soft, non tender, non distended, bowel sounds present  Extremities: No edema, pulses DP and PT palpable bilaterally  Neuro: Grossly nonfocal  Data Reviewed: Basic Metabolic Panel:  Recent Labs Lab 2014/06/15 1112 06/03/14 0518  NA 131* 135  K 4.1 4.3  CL 97 102  CO2 24 27  GLUCOSE 123* 120*  BUN  9 6  CREATININE 0.84 0.74  CALCIUM 9.3 8.6   Liver Function Tests:  Recent Labs Lab 06/02/14 1112 06/03/14 0518  AST 18 15  ALT 15 12  ALKPHOS 76 62  BILITOT 2.1* 1.2  PROT 8.1 6.6  ALBUMIN 4.2 3.3*   No results for input(s): LIPASE, AMYLASE in the last 168 hours. No results for input(s): AMMONIA in the last 168 hours. CBC:  Recent Labs Lab 06/02/14 1112 06/03/14 0518  WBC 22.2* 24.2*  NEUTROABS 18.0*  --   HGB 8.8* 7.6*  HCT 26.0* 22.8*  MCV 93.2 94.6  PLT 867* 865*   Cardiac Enzymes: No results for  input(s): CKTOTAL, CKMB, CKMBINDEX, TROPONINI in the last 168 hours. BNP: Invalid input(s): POCBNP CBG:  Recent Labs Lab 06/03/14 0846 06/04/14 0758  GLUCAP 112* 97    Recent Results (from the past 240 hour(s))  Blood culture (routine x 2)     Status: None (Preliminary result)   Collection Time: 06/02/14 11:12 AM  Result Value Ref Range Status   Specimen Description BLOOD LEFT ANTECUBITAL  Final   Special Requests BOTTLES DRAWN AEROBIC AND ANAEROBIC 5 CC  Final   Culture   Final           BLOOD CULTURE RECEIVED NO GROWTH TO DATE CULTURE WILL BE HELD FOR 5 DAYS BEFORE ISSUING A FINAL NEGATIVE REPORT Performed at [facility]    Report Status PENDING  Incomplete  Blood culture (routine x 2)     Status: None (Preliminary result)   Collection Time: 06/02/14 11:38 AM  Result Value Ref Range Status   Specimen Description BLOOD RFA  Final   Special Requests BOTTLES DRAWN AEROBIC AND ANAEROBIC 3CC  Final   Culture   Final           BLOOD CULTURE RECEIVED NO GROWTH TO DATE CULTURE WILL BE HELD FOR 5 DAYS BEFORE ISSUING A FINAL NEGATIVE REPORT Performed at [facility]    Report Status PENDING  Incomplete     Scheduled Meds: . antiseptic oral rinse  7 mL Mouth Rinse q12n4p  . ceFEPime (MAXIPIME) IV  1 g Intravenous 3 times per day  . chlorhexidine  15 mL Mouth Rinse BID  . hydroxyurea  1,500 mg Oral Daily  . ipratropium-albuterol  3 mL Nebulization BID  . morphine   Intravenous 6 times per day  . sodium chloride  3 mL Intravenous Q12H  . vancomycin  1,250 mg Intravenous Q8H   Continuous Infusions: . sodium chloride 75 mL/hr (06/04/14 0311)

## 2014-06-04 NOTE — Progress Notes (Addendum)
ANTIBIOTIC CONSULT NOTE - Follow Up  Pharmacy Consult for Vancomycin/Cefepime Indication: HCAP  No Known Allergies  Patient Measurements: Height: 5\' 8"  (172.7 cm) Weight: 136 lb 3.2 oz (61.78 kg) IBW/kg (Calculated) : 68.4  Vital Signs: Temp: 98.5 F (36.9 C) (04/17 0552) Temp Source: Oral (04/17 0552) BP: 125/68 mmHg (04/17 0552) Pulse Rate: 98 (04/17 0552) Intake/Output from previous day: 04/16 0701 - 04/17 0700 In: 2565 [P.O.:480; I.V.:1335; IV Piggyback:750] Out: 1850 [Urine:1850] Intake/Output from this shift: Total I/O In: -  Out: 800 [Urine:800]  Labs:  Recent Labs  06/02/14 1112 06/03/14 0518  WBC 22.2* 24.2*  HGB 8.8* 7.6*  PLT 867* 865*  CREATININE 0.84 0.74   Estimated Creatinine Clearance: 127.7 mL/min (by C-G formula based on Cr of 0.74).  Recent Labs  06/03/14 2102  VANCOTROUGH 9.5*      Assessment: 19yoM w/ sickle cell anemia presenting with SOB, fever, cough. Recently admitted for acute chest and head CT angio that showed no PE. Went home several days ago and for the last 2 days had progressive right-sided rib and chest pain.  Patchy infiltrate in RLL suspicious for recurrent PNA on CXR.  Initially ordered vancomycin/Zosyn; pharmacy to dose vancomycin/cefepime for HCAP.  4/15 >> Cefepime >> 4/15 >> Vancomycin >>    Tmax: 102.7 WBCs: elevated Renal: SCr wnl; CrCl > 100 CG PCT 0.12 (4/15)  4/15 blood: ngtd 4/15 sputum:  4/15 influenza panel: neg 4/15 Legionella/Strep UAg: IP/neg 4/15 HIV: neg  Drug level / dose changes info: 4/16 VT 9.5 on 1g q8h dosing --> dose increased to 1250mg  q8h  Today is day #3 vancomycin 1250mg  q8h and cefepime 2g x 1 then 1g q8h for HCAP. Recent acute chest admission.   Goal of Therapy:   Vancomycin trough level 15-20 mcg/ml   Eradication of infection  Appropriate antibiotic dosing for indication and renal function  Plan:  1.  Recheck vancomycin trough with AM labs at 0500 4/18. 2.  Continue  Cefepime 1g q8h. 3.  F/u culture results, clinical course.  Hershal Coria, PharmD, BCPS Pager: [phone] 06/04/2014 12:58 PM

## 2014-06-05 LAB — GLUCOSE, CAPILLARY: GLUCOSE-CAPILLARY: 113 mg/dL — AB (ref 70–99)

## 2014-06-05 LAB — LEGIONELLA ANTIGEN, URINE

## 2014-06-05 LAB — VANCOMYCIN, TROUGH: Vancomycin Tr: 13.6 ug/mL (ref 10.0–20.0)

## 2014-06-05 MED ORDER — MORPHINE SULFATE 2 MG/ML IJ SOLN
2.0000 mg | INTRAMUSCULAR | Status: DC | PRN
  Administered 2014-06-05 – 2014-06-07 (×6): 2 mg via INTRAVENOUS
  Filled 2014-06-05 (×6): qty 1

## 2014-06-05 MED ORDER — VANCOMYCIN HCL 10 G IV SOLR
1500.0000 mg | Freq: Three times a day (TID) | INTRAVENOUS | Status: DC
  Administered 2014-06-05 – 2014-06-06 (×4): 1500 mg via INTRAVENOUS
  Filled 2014-06-05 (×5): qty 1500

## 2014-06-05 NOTE — Progress Notes (Signed)
Patient ID: Jason Franklin, male   DOB: 06-Jun-1995, 19 y.o.   MRN: [id number] [facility] PROGRESS NOTE  Jason Franklin [id number] DOB: [birth year] DOA: Jun 29, 2014 PCP: Benito Mccreedy, MD  Brief narrative:    75 -year-old male with past medical history of sickle cell disease and related anemia, recent admission for community-acquired pneumonia. He presented to [facility] hospital with reports of worsening shortness of breath, fever and cough over past few days prior to this admission. He was found to have patchy infiltrate in right lower lobe suspicious for recurrent pneumonia based on admission CXR. Started on vanco and cefepime.    Assessment/Plan:    Principal Problem: Sepsis secondary to healthcare associated pneumonia / lobar pneumonia / leukocytosis - Sepsis criteria met on the admission with fever, tachycardia, tachypnea in addition to leukocytosis. CXR revealed right lower lobe pneumonia. - Sepsis order set placed on admission. Lactic acid and procalcitonin level WNL. - Patient was started on vancomycin and cefepime on the admission. So far influenza panel is negative. Blood cultures are negative. Strep pneumonia is negative. HIV is nonreactive. Legionella is still pending. - We will continue antibiotics because patient spiked fever in last 24 hours.  - Respiratory status stable.  Active Problems: Sickle cell pain crisis - Patient is on Dilaudid PCA for pain management. Pain is 5 out of 10 this morning. Changed to morphine 2 mg IV every 2 hours as needed for pain control.  Sickle cell anemia - Hemoglobin 8.8 on the admission.  - Hemoglobin down to 7.6 on 06/03/2014. Will repeat CBC tomorrow morning. Currently no indications for transfusion.  Hyponatremia - Likely prerenal due to sepsis, pneumonia - Resolved with IV fluids     DVT prophylaxis:  - SCDs bilaterally while patient is in hospital   Code Status: Full.  Family Communication:  plan of care discussed  with the patient Disposition Plan: Continue current antibiotics. Follow-up CBC tomorrow morning. Patient is not stable for discharge this morning.   IV access:  Peripheral IV  Procedures and diagnostic studies:    Dg Chest Port 1 View 29-Jun-2014 Patchy infiltrate in right lower lobe suspicious for recurrent pneumonia. No pulmonary edema.   Electronically Signed   By: Lahoma Crocker M.D.   On: Jun 29, 2014 12:07   Medical Consultants:  None   Other Consultants:  None   IAnti-Infectives:   Vanco Jun 29, 2014 --> Cefepime 29-Jun-2014 -->   Leisa Lenz, MD  [facility] Pager [phone]  Time spent in minutes:25 minutes  If 7PM-7AM, please contact night-coverage [email or web address] Password [facility] 06/05/2014, 11:36 AM   LOS: 3 days    HPI/Subjective: No acute overnight events. Patient rates pain 5 out of 10 in intensity. Still has some pain in chest. No shortness of breath.  Objective: Filed Vitals:   06/05/14 0032 06/05/14 0432 06/05/14 0535 06/05/14 0904  BP:   120/60   Pulse:   97   Temp:   98.6 F (37 C)   TempSrc:   Oral   Resp: '21 22 22   ' Height:      Weight:   61.462 kg (135 lb 8 oz)   SpO2: 100% 100% 100% 90%    Intake/Output Summary (Last 24 hours) at 06/05/14 1136 Last data filed at 06/05/14 0852  Gross per 24 hour  Intake   3670 ml  Output   2170 ml  Net   1500 ml    Exam:   General:  Pt is alert, no distress  Cardiovascular: Regular rhythm, appreciate S1-S2  Respiratory: Bilateral air entry, no wheezing  Abdomen: Nontender, nondistended, appreciate bowel sounds  Extremities: No leg swelling, pulses palpable  Neuro: Nonfocal.  Data Reviewed: Basic Metabolic Panel:  Recent Labs Lab 06/02/14 1112 06/03/14 0518  NA 131* 135  K 4.1 4.3  CL 97 102  CO2 24 27  GLUCOSE 123* 120*  BUN 9 6  CREATININE 0.84 0.74  CALCIUM 9.3 8.6   Liver Function Tests:  Recent Labs Lab 06/02/14 1112 06/03/14 0518  AST 18 15  ALT 15 12  ALKPHOS 76 62   BILITOT 2.1* 1.2  PROT 8.1 6.6  ALBUMIN 4.2 3.3*   No results for input(s): LIPASE, AMYLASE in the last 168 hours. No results for input(s): AMMONIA in the last 168 hours. CBC:  Recent Labs Lab 06/02/14 1112 06/03/14 0518  WBC 22.2* 24.2*  NEUTROABS 18.0*  --   HGB 8.8* 7.6*  HCT 26.0* 22.8*  MCV 93.2 94.6  PLT 867* 865*   Cardiac Enzymes: No results for input(s): CKTOTAL, CKMB, CKMBINDEX, TROPONINI in the last 168 hours. BNP: Invalid input(s): POCBNP CBG:  Recent Labs Lab 06/03/14 0846 06/04/14 0758 06/05/14 0733  GLUCAP 112* 97 113*    Recent Results (from the past 240 hour(s))  Blood culture (routine x 2)     Status: None (Preliminary result)   Collection Time: 06/02/14 11:12 AM  Result Value Ref Range Status   Specimen Description BLOOD LEFT ANTECUBITAL  Final   Special Requests BOTTLES DRAWN AEROBIC AND ANAEROBIC 5 CC  Final   Culture   Final           BLOOD CULTURE RECEIVED NO GROWTH TO DATE CULTURE WILL BE HELD FOR 5 DAYS BEFORE ISSUING A FINAL NEGATIVE REPORT Performed at [facility]    Report Status PENDING  Incomplete  Blood culture (routine x 2)     Status: None (Preliminary result)   Collection Time: 06/02/14 11:38 AM  Result Value Ref Range Status   Specimen Description BLOOD RFA  Final   Special Requests BOTTLES DRAWN AEROBIC AND ANAEROBIC 3CC  Final   Culture   Final           BLOOD CULTURE RECEIVED NO GROWTH TO DATE CULTURE WILL BE HELD FOR 5 DAYS BEFORE ISSUING A FINAL NEGATIVE REPORT Performed at [facility]    Report Status PENDING  Incomplete     Scheduled Meds: . antiseptic oral rinse  7 mL Mouth Rinse q12n4p  . ceFEPime (MAXIPIME) IV  1 g Intravenous 3 times per day  . chlorhexidine  15 mL Mouth Rinse BID  . hydroxyurea  1,500 mg Oral Daily  . ipratropium-albuterol  3 mL Nebulization BID  . sodium chloride  3 mL Intravenous Q12H  . vancomycin  1,500 mg Intravenous 3 times per day   Continuous Infusions: .  sodium chloride 30 mL/hr (06/05/14 1006)

## 2014-06-06 DIAGNOSIS — D473 Essential (hemorrhagic) thrombocythemia: Secondary | ICD-10-CM

## 2014-06-06 LAB — EXPECTORATED SPUTUM ASSESSMENT W GRAM STAIN, RFLX TO RESP C

## 2014-06-06 LAB — BASIC METABOLIC PANEL
Anion gap: 7 (ref 5–15)
BUN: <5 mg/dL — ABNORMAL LOW (ref 6–23)
CALCIUM: 8.6 mg/dL (ref 8.4–10.5)
CO2: 28 mmol/L (ref 19–32)
Chloride: 102 mmol/L (ref 96–112)
Creatinine, Ser: 0.68 mg/dL (ref 0.50–1.35)
GFR calc Af Amer: >90 mL/min (ref >90)
Glucose, Bld: 96 mg/dL (ref 70–99)
POTASSIUM: 4.3 mmol/L (ref 3.5–5.1)
SODIUM: 137 mmol/L (ref 135–145)

## 2014-06-06 LAB — CBC
HEMATOCRIT: 22.1 % — AB (ref 39.0–52.0)
Hemoglobin: 7.5 g/dL — ABNORMAL LOW (ref 13.0–17.0)
MCH: 31 pg (ref 26.0–34.0)
MCHC: 33.9 g/dL (ref 30.0–36.0)
MCV: 91.3 fL (ref 78.0–100.0)
PLATELETS: 1259 10*3/uL — AB (ref 150–400)
RBC: 2.42 MIL/uL — ABNORMAL LOW (ref 4.22–5.81)
RDW: 20.4 % — ABNORMAL HIGH (ref 11.5–15.5)
WBC: 11.8 10*3/uL — AB (ref 4.0–10.5)

## 2014-06-06 LAB — EXPECTORATED SPUTUM ASSESSMENT W REFEX TO RESP CULTURE

## 2014-06-06 MED ORDER — ASPIRIN EC 81 MG PO TBEC
81.0000 mg | DELAYED_RELEASE_TABLET | Freq: Every day | ORAL | Status: DC
  Administered 2014-06-06 – 2014-06-07 (×2): 81 mg via ORAL
  Filled 2014-06-06 (×2): qty 1

## 2014-06-06 MED ORDER — IPRATROPIUM-ALBUTEROL 0.5-2.5 (3) MG/3ML IN SOLN: 3.0000 mL | RESPIRATORY_TRACT | Status: DC | PRN

## 2014-06-06 MED ORDER — ENOXAPARIN SODIUM 40 MG/0.4ML ~~LOC~~ SOLN
40.0000 mg | SUBCUTANEOUS | Status: DC
  Administered 2014-06-06 – 2014-06-07 (×2): 40 mg via SUBCUTANEOUS
  Filled 2014-06-06 (×2): qty 0.4

## 2014-06-06 MED ORDER — SODIUM CHLORIDE 0.45 % IV SOLN
INTRAVENOUS | Status: DC
  Administered 2014-06-06 – 2014-06-07 (×2): via INTRAVENOUS

## 2014-06-06 MED ORDER — SODIUM CHLORIDE 0.45 % IV SOLN
INTRAVENOUS | Status: DC
  Filled 2014-06-06: qty 1000

## 2014-06-06 MED ORDER — LEVOFLOXACIN IN D5W 750 MG/150ML IV SOLN
750.0000 mg | INTRAVENOUS | Status: DC
  Administered 2014-06-06: 750 mg via INTRAVENOUS
  Filled 2014-06-06: qty 150

## 2014-06-06 NOTE — Progress Notes (Signed)
Patient ID: Jason Franklin, male   DOB: [birth year], 19 y.o.   MRN: [id number] [facility] PROGRESS NOTE  Jason Franklin QIW:[id number] DOB: 10/09/1995 DOA: 18-Jun-2014 PCP: Benito Mccreedy, MD  Brief narrative:    19 year-old male with past medical history of sickle cell disease and related anemia, recent admission for community-acquired pneumonia. He presented to [facility] hospital with reports of worsening shortness of breath, fever and cough over past few days prior to this admission. He was found to have patchy infiltrate in right lower lobe suspicious for recurrent pneumonia based on admission CXR. Patient was started on broad-spectrum antibiotics, vancomycin and cefepime. His blood cultures so far show no growth to date.  Hospital course complicated with worsening thrombocytosis. Platelet count this morning 1259. I spoke with Dr. Jonelle Sidle who recommended talking to hematology. I spoke with PA hematology Sharene Butters) who recomended starting aspirin and continuing to monitor CBC patient is on hydroxyurea which should help with thrombocytosis. Patient does not have reports of chest pain, he is comfortable at this time. I also paged sickle cell listed in [employer] [place] [phone] but i am awaiting for call back.    Assessment/Plan:    Principal Problem: Sepsis secondary to healthcare associated pneumonia / lobar pneumonia / leukocytosis - Patient was found to have sepsis secondary to pneumonia on the admission. Sepsis criteria met with fever, tachycardia, tachypnea, leukocytosis. Lactic acid and procalcitonin level WNL. Source of infection - right lower lobe pneumonia based on chest x-ray done on the admission. - Patient was started on broad spectrum antibiotics, vancomycin and cefepime. He has been afebrile in past 24 hours so we will change antibiotics to Levaquin starting today. - Off note, strep pneumonia, Legionella, HIV and influenza panel are all negative. Blood cultures to date are  negative. - Respiratory status is stable. Continue DuoNeb nebulizer twice daily as well as every 4 hours if needed for shortness of breath or wheezing.   Active Problems: Sickle cell disease - No acute cell crisis. Patient rates pain 3 out of 10 this morning. He is on morphine 2 mg IV every 2 hours as needed for severe pain and Norco every 4 hours as needed for moderate pain. - He was on Dilaudid PCA on the admission. This was stopped 06/04/2014.  Sickle cell anemia - Hemoglobin 8.8 on the admission. Hemoglobin down to 7.5 this morning. - No reports of bleeding. No indications for transfusion at this time.  Thrombocytosis - As noted above, patient has platelet count of 1259 this morning. He is on hydroxyurea and we added aspirin today. - I tried getting in touch with sickle cell day center, paged him at [phone] and awaiting response. Per Dr. Jonelle Sidle recommendation to talk to heme. I spoke with hematology PA recommended aspirin and continuing hydroxyurea.  Hyponatremia - Likely prerenal due to sepsis, pneumonia - Resolved with IV fluids    DVT prophylaxis - Lovenox subcutaneous ordered.   Code Status: Full.  Family Communication:  plan of care discussed with the patient Disposition Plan: Continue current antibiotics. Concern is rising platelet count. In my discussion with hematology recommendation is to continue to monitor CBC, continue hydroxyurea and start aspirin.   IV access:  Peripheral IV  Procedures and diagnostic studies:    Dg Chest Port 1 View Jun 18, 2014 Patchy infiltrate in right lower lobe suspicious for recurrent pneumonia. No pulmonary edema.   Electronically Signed   By: Lahoma Crocker M.D.   On: 2014-06-18 12:07   Medical Consultants:  Hematology -  phone call only Sickle cell, paged [place] [phone] and communicated with Dr. Jonelle Sidle   Other Consultants:  None   IAnti-Infectives:   Vanco 06/02/2014 --> 06/06/2014  Cefepime 06/02/2014 --> 06/06/2014  Levaquin 06/06/2014  -->   Jason Lenz, MD  [facility] Pager [phone]  Time spent in minutes:25 minutes  If 7PM-7AM, please contact night-coverage [email or web address] Password [email or web address] 06/06/2014, 10:04 AM   LOS: 4 days    HPI/Subjective: No acute overnight events. Patient reports pain is under good control this morning. He says he occasionally has chest discomfort with inspiration. No reports of shortness of breath.  Objective: Filed Vitals:   06/05/14 2051 06/05/14 2248 06/06/14 0619 06/06/14 0741  BP:  119/56 118/62   Pulse:  114 95   Temp:  99.9 F (37.7 C) 98.4 F (36.9 C)   TempSrc:  Oral Oral   Resp:  20 20   Height:      Weight:      SpO2: 96% 96% 96% 95%    Intake/Output Summary (Last 24 hours) at 06/06/14 1004 Last data filed at 06/06/14 0541  Gross per 24 hour  Intake    840 ml  Output   3650 ml  Net  -2810 ml    Exam:   General:  Pt is sleepy this morning, no acute distress  Cardiovascular: Rate control, regular rhythm, appreciate S1-S2  Respiratory: Clear to auscultation bilaterally, no wheezing  Abdomen: Abdomen is nondistended and nontender, bowel sounds appreciated  Extremities: No lower extremity swelling, no cyanosis, pulses palpable  Neuro: No focal neurological deficits.  Data Reviewed: Basic Metabolic Panel:  Recent Labs Lab 06/02/14 1112 06/03/14 0518 06/06/14 0506  NA 131* 135 137  K 4.1 4.3 4.3  CL 97 102 102  CO2 '24 27 28  ' GLUCOSE 123* 120* 96  BUN 9 6 <5*  CREATININE 0.84 0.74 0.68  CALCIUM 9.3 8.6 8.6   Liver Function Tests:  Recent Labs Lab 06/02/14 1112 06/03/14 0518  AST 18 15  ALT 15 12  ALKPHOS 76 62  BILITOT 2.1* 1.2  PROT 8.1 6.6  ALBUMIN 4.2 3.3*   No results for input(s): LIPASE, AMYLASE in the last 168 hours. No results for input(s): AMMONIA in the last 168 hours. CBC:  Recent Labs Lab 06/02/14 1112 06/03/14 0518 06/06/14 0506  WBC 22.2* 24.2* 11.8*  NEUTROABS 18.0*  --   --   HGB 8.8* 7.6* 7.5*  HCT  26.0* 22.8* 22.1*  MCV 93.2 94.6 91.3  PLT 867* 865* 1259*   Cardiac Enzymes: No results for input(s): CKTOTAL, CKMB, CKMBINDEX, TROPONINI in the last 168 hours. BNP: Invalid input(s): POCBNP CBG:  Recent Labs Lab 06/03/14 0846 06/04/14 0758 06/05/14 0733  GLUCAP 112* 97 113*    Recent Results (from the past 240 hour(s))  Blood culture (routine x 2)     Status: None (Preliminary result)   Collection Time: 06/02/14 11:12 AM  Result Value Ref Range Status   Specimen Description BLOOD LEFT ANTECUBITAL  Final   Special Requests BOTTLES DRAWN AEROBIC AND ANAEROBIC 5 CC  Final   Culture   Final           BLOOD CULTURE RECEIVED NO GROWTH TO DATE CULTURE WILL BE HELD FOR 5 DAYS BEFORE ISSUING A FINAL NEGATIVE REPORT Performed at [facility]    Report Status PENDING  Incomplete  Blood culture (routine x 2)     Status: None (Preliminary result)   Collection Time: 06/02/14 11:38 AM  Result Value  Ref Range Status   Specimen Description BLOOD RFA  Final   Special Requests BOTTLES DRAWN AEROBIC AND ANAEROBIC 3CC  Final   Culture   Final           BLOOD CULTURE RECEIVED NO GROWTH TO DATE CULTURE WILL BE HELD FOR 5 DAYS BEFORE ISSUING A FINAL NEGATIVE REPORT Performed at [facility]    Report Status PENDING  Incomplete     Scheduled Meds: . antiseptic oral rinse  7 mL Mouth Rinse q12n4p  . aspirin EC  81 mg Oral Daily  . ceFEPime (MAXIPIME) IV  1 g Intravenous 3 times per day  . chlorhexidine  15 mL Mouth Rinse BID  . hydroxyurea  1,500 mg Oral Daily  . ipratropium-albuterol  3 mL Nebulization BID  . sodium chloride  3 mL Intravenous Q12H  . vancomycin  1,500 mg Intravenous 3 times per day   Continuous Infusions: . sodium chloride 0.45 % 1,000 mL infusion

## 2014-06-06 NOTE — Progress Notes (Signed)
ANTICOAGULATION CONSULT NOTE - Initial Consult  Pharmacy Consult for Lovenox Indication: VTE prophylaxis  No Known Allergies  Patient Measurements: Height: 5\' 8"  (172.7 cm) Weight: 135 lb 8 oz (61.462 kg) IBW/kg (Calculated) : 68.4  Vital Signs: Temp: 98.4 F (36.9 C) (04/19 0619) Temp Source: Oral (04/19 0619) BP: 118/62 mmHg (04/19 0619) Pulse Rate: 95 (04/19 0619)  Labs:  Recent Labs  06/06/14 0506  HGB 7.5*  HCT 22.1*  PLT 1259*  CREATININE 0.68    Estimated Creatinine Clearance: 127.1 mL/min (by C-G formula based on Cr of 0.68).   Medical History: Past Medical History  Diagnosis Date  . Asthma   . Sickle cell disease     Assessment: 19yoM w/ sickle cell anemia admitted 4/15 with SOB, fever, cough. He was initially ordered SCDs for VTE prophylaxis.  Pharmacy is now consulted to add Lovenox for prophylaxis.  Hgb 7.5, low and decreased (known sickle cell disease) Plt elevated at 1259 SCr 0.68, CrCl > 100 ml/min No bleeding or complications reported  Goal of Therapy:  VTE prophylaxis Monitor platelets by anticoagulation protocol: Yes   Plan:   Lovenox 40mg  SQ q24h  Follow up renal function, CBC, and bleeding peripherally.  Pharmacy to s/o note writing.  Gretta Arab PharmD, BCPS Pager [phone] 06/06/2014 10:34 AM

## 2014-06-06 NOTE — Progress Notes (Signed)
ANTIBIOTIC CONSULT NOTE - FOLLOW UP  Pharmacy Consult for Levaquin Indication: HCAP  No Known Allergies  Patient Measurements: Height: 5\' 8"  (172.7 cm) Weight: 135 lb 8 oz (61.462 kg) IBW/kg (Calculated) : 68.4  Vital Signs: Temp: 98.4 F (36.9 C) (04/19 0619) Temp Source: Oral (04/19 0619) BP: 118/62 mmHg (04/19 0619) Pulse Rate: 95 (04/19 0619) Intake/Output from previous day: 04/18 0701 - 04/19 0700 In: 840 [P.O.:120; I.V.:120; IV Piggyback:600] Out: 4170 [Urine:4170]  Labs:  Recent Labs  06/06/14 0506  WBC 11.8*  HGB 7.5*  PLT 1259*  CREATININE 0.68   Estimated Creatinine Clearance: 127.1 mL/min (by C-G formula based on Cr of 0.68).  Recent Labs  06/03/14 2102 06/05/14 0523  VANCOTROUGH 9.5* 13.6    Assessment: 19yoM w/ sickle cell anemia presenting with SOB, fever, cough. Recently admitted for acute chest and head CT angio that showed no PE. Went home several days ago and for the last 2 days had progressive right-sided rib and chest pain. Patchy infiltrate in RLL suspicious for recurrent PNA on CXR. Initially ordered vancomycin/Zosyn; pharmacy to dose vancomycin/cefepime for HCAP.  Therapy is now narrowed and Pharmacy is consulted to dose Levaquin.  4/15 >> Cefepime >> 4/19 4/15 >> Vancomycin >>  4/19 4/19 >> Levaquin >>  4/15 blood x2: ngtd 4/15 sputum: ordered 4/15 influenza panel: neg 4/15 Legionella/Strep UAg: neg 4/15 HIV: neg  4/19: Day #5 Vancomycin and Cefepime, now D#1 Levaquin 750mg  IV q24h.  Recent acute chest admission.   Tmax: improved, 99.9 WBCs: elevated but improved, 11.8 Renal: SCr 0.68, CrCl > 100 CG/N Vancomycin trough level last below goal (13.6) on 4/18 and dose adjusted.   Goal of Therapy:  Appropriate abx dosing, eradication of infection.   Plan:   Levaquin 750mg  IV q24h  Follow up renal fxn, culture results, and clinical course.   Gretta Arab PharmD, BCPS Pager [phone] 06/06/2014 8:41 AM

## 2014-06-06 NOTE — Progress Notes (Signed)
CRITICAL VALUE ALERT  Critical value received:  Platelets 1259  Date of notification:  06/06/14  Time of notification:  0609  Critical value read back:Yes.    Nurse who received alert:  Cristy, RN  MD notified (1st page):  K.Kirby, NP  Time of first page:  0610  MD notified (2nd page):  Time of second page:  Responding MD:    Time MD responded:    NP made aware

## 2014-06-07 DIAGNOSIS — D75839 Thrombocytosis, unspecified: Secondary | ICD-10-CM

## 2014-06-07 DIAGNOSIS — D473 Essential (hemorrhagic) thrombocythemia: Secondary | ICD-10-CM

## 2014-06-07 LAB — BASIC METABOLIC PANEL
Anion gap: 9 (ref 5–15)
BUN: 6 mg/dL (ref 6–23)
CO2: 25 mmol/L (ref 19–32)
CREATININE: 0.61 mg/dL (ref 0.50–1.35)
Calcium: 9 mg/dL (ref 8.4–10.5)
Chloride: 103 mmol/L (ref 96–112)
GFR calc Af Amer: >90 mL/min (ref >90)
Glucose, Bld: 91 mg/dL (ref 70–99)
Potassium: 4.5 mmol/L (ref 3.5–5.1)
SODIUM: 137 mmol/L (ref 135–145)

## 2014-06-07 LAB — CBC
HCT: 24.5 % — ABNORMAL LOW (ref 39.0–52.0)
Hemoglobin: 8.1 g/dL — ABNORMAL LOW (ref 13.0–17.0)
MCH: 30.5 pg (ref 26.0–34.0)
MCHC: 33.1 g/dL (ref 30.0–36.0)
MCV: 92.1 fL (ref 78.0–100.0)
Platelets: 1188 10*3/uL (ref 150–400)
RBC: 2.66 MIL/uL — AB (ref 4.22–5.81)
RDW: 21.5 % — ABNORMAL HIGH (ref 11.5–15.5)
WBC: 8.9 10*3/uL (ref 4.0–10.5)

## 2014-06-07 MED ORDER — CEFUROXIME AXETIL 500 MG PO TABS: 500.0000 mg | ORAL_TABLET | Freq: Two times a day (BID) | ORAL | Status: DC

## 2014-06-07 MED ORDER — LEVOFLOXACIN 750 MG PO TABS: 750.0000 mg | ORAL_TABLET | Freq: Every day | ORAL | Status: DC

## 2014-06-07 MED ORDER — DOXYCYCLINE HYCLATE 100 MG PO TABS
100.0000 mg | ORAL_TABLET | Freq: Two times a day (BID) | ORAL | Status: DC
  Administered 2014-06-07: 100 mg via ORAL
  Filled 2014-06-07: qty 1

## 2014-06-07 MED ORDER — ASPIRIN EC 325 MG PO TBEC: 325.0000 mg | DELAYED_RELEASE_TABLET | Freq: Every day | ORAL | Status: DC

## 2014-06-07 MED ORDER — ASPIRIN 81 MG PO TBEC: 81.0000 mg | DELAYED_RELEASE_TABLET | Freq: Every day | ORAL | Status: DC

## 2014-06-07 MED ORDER — DOXYCYCLINE HYCLATE 100 MG PO TABS: 100.0000 mg | ORAL_TABLET | Freq: Two times a day (BID) | ORAL | Status: DC

## 2014-06-07 NOTE — Discharge Summary (Signed)
Physician Discharge Summary  Jason Franklin TDH:[id number] DOB: 07-24-1995 DOA: 06/02/2014  PCP: Benito Mccreedy, MD  Admit date: 06/02/2014 Discharge date: 06/07/2014  Time spent: 50 minutes  Recommendations for Outpatient Follow-up:  1. Repeat platelet count in 1-2 weeks   Discharge Condition: Stable Diet recommendation: Low Sodium heart healthy diet  Discharge Diagnoses:  Principal Problem:   Sepsis Active Problems:   Leucocytosis   HCAP (healthcare-associated pneumonia)   Sickle cell anemia   Thrombocytosis Hyponatremia    History of present illness:  Is a 19 year old male with sickle cell disease and anemia who was recently admitted for sickle cell crisis and found to have what was suspected to be a community-acquired pneumonia and discharged on 4/6 home with Levaquin for 6 more days. He tells me today that he is not sure of his symptoms even improved on this medication. He took 5 days and forgot to take the sixth. He will return to the hospital on 4/15 which shortness of breath fever and cough. He had pain in the right side of the chest 8 out of 10. He was found to have a patchy infiltrate in the right lower lobe and was readmitted.  Hospital Course:  Sepsis secondary to healthcare associated pneumonia / lobar pneumonia / leukocytosis - Patient was found to have sepsis secondary to pneumonia on the admission. Sepsis criteria met with fever, tachycardia, tachypnea, leukocytosis. Lactic acid and procalcitonin level WNL. Source of infection - right lower lobe pneumonia based on chest x-ray done on the admission. -Suspect for pneumonia was inadequately treated and therefore we may still be treating a community-acquired pneumonia - Patient was started on broad spectrum antibiotics-  vancomycin and cefepime and has improved significantly- he has mild amount of dry cough at this time -CBC count which was 24.2 on admission has now normalized -Transitioned to levofloxacin and  doxycycline to complete a total of 10 days - strep pneumonia, Legionella, HIV and influenza panel are all negative. Blood cultures to date are negative.   Active Problems: Sickle cell disease - No acute cell crisis.   Sickle cell anemia - Hemoglobin remained relatively stable - No reports of bleeding. No indications for transfusion at this time.  Thrombocytosis - Patient has platelet crept up to 1259 on 4/19 - He is on hydroxyurea  -Spoken with hematology-Dr. course H who recommends adding a daily aspirin 81 mg for 2 weeks - she suspects functional asplenia as the cause - Recommends getting another platelet count in 1-2 weeks which I have recommended to the patient-he appears to be hesitant to follow-up with his PCP but has been encouraged to  Hyponatremia - Likely prerenal due to sepsis, pneumonia - Resolved with IV fluids    Procedures:    Consultations:  Phone consult with hematology- [place]  Discharge Exam: [facility] Weights   06/03/14 0700 06/04/14 0552 06/05/14 0535  Weight: 60.8 kg (134 lb 0.6 oz) 61.78 kg (136 lb 3.2 oz) 61.462 kg (135 lb 8 oz)   Filed Vitals:   06/07/14 0601  BP: 103/50  Pulse: 65  Temp: 97.8 F (36.6 C)  Resp: 20    General: AAO x 3, no distress Cardiovascular: RRR, no murmurs  Respiratory: clear to auscultation bilaterally GI: soft, non-tender, non-distended, bowel sound positive  Discharge Instructions You were cared for by a hospitalist during your hospital stay. If you have any questions about your discharge medications or the care you received while you were in the hospital after you are discharged, you can call the  unit and asked to speak with the hospitalist on call if the hospitalist that took care of you is not available. Once you are discharged, your primary care physician will handle any further medical issues. Please note that NO REFILLS for any discharge medications will be authorized once you are discharged, as it is imperative  that you return to your primary care physician (or establish a relationship with a primary care physician if you do not have one) for your aftercare needs so that they can reassess your need for medications and monitor your lab values.      Discharge Instructions    Diet - low sodium heart healthy    Complete by:  As directed      Discharge instructions    Complete by:  As directed   You will need to see your family doctor in 1 wk to have blood work done to ensure your platelet count is improving.  Return to family doctor sooner if your pneumonia does not seem to be improving.     Increase activity slowly    Complete by:  As directed             Medication List    STOP taking these medications        calcium carbonate 750 MG chewable tablet  Commonly known as:  TUMS EX      TAKE these medications        albuterol 108 (90 BASE) MCG/ACT inhaler  Commonly known as:  PROVENTIL HFA;VENTOLIN HFA  Inhale 2 puffs into the lungs every 4 (four) hours as needed for wheezing or shortness of breath.     aspirin 81 MG EC tablet  Take 1 tablet (81 mg total) by mouth daily.     aspirin EC 325 MG tablet  Take 1 tablet (325 mg total) by mouth daily.     doxycycline 100 MG tablet  Commonly known as:  VIBRA-TABS  Take 1 tablet (100 mg total) by mouth every 12 (twelve) hours.     folic acid 1 MG tablet  Commonly known as:  FOLVITE  Take 1 tablet (1 mg total) by mouth daily.     hydroxyurea 500 MG capsule  Commonly known as:  HYDREA  Take 1,500 mg by mouth daily. May take with food to minimize GI side effects.     levofloxacin 750 MG tablet  Commonly known as:  LEVAQUIN  Take 1 tablet (750 mg total) by mouth daily.     ondansetron 4 MG disintegrating tablet  Commonly known as:  ZOFRAN ODT  Take 1 tablet (4 mg total) by mouth every 8 (eight) hours as needed for nausea.     oxyCODONE-acetaminophen 10-325 MG per tablet  Commonly known as:  PERCOCET  Take 1 tablet by mouth every 6  (six) hours as needed for pain.       No Known Allergies    The results of significant diagnostics from this hospitalization (including imaging, microbiology, ancillary and laboratory) are listed below for reference.    Significant Diagnostic Studies: Dg Chest 2 View  05/23/2014   CLINICAL DATA:  Subsequent encounter shortness of breath productive cough, history of sickle cell and asthma  EXAM: CHEST  2 VIEW  COMPARISON:  05/20/2014 radiographs and CT thorax  FINDINGS: Mild increased cardiac silhouette when compared to prior study. Vascular pattern normal.  New pulmonary parenchymal opacity in the right lower lobe and left lower lobe.  IMPRESSION: New bilateral lower lobe opacities concerning for Acute  Chest Syndrome. Possible causes include pulmonary infarctions as well as bilateral pneumonia.   Electronically Signed   By: Skipper Cliche M.D.   On: 05/23/2014 15:38   Dg Chest 2 View  05/20/2014   CLINICAL DATA:  Sickle cell crisis with chest pain  EXAM: CHEST  2 VIEW  COMPARISON:  03/02/2014  FINDINGS: Cardiac shadow is within normal limits. The lungs are well aerated bilaterally. No focal infiltrate or sizable effusion is seen. No acute bony abnormality is noted.  IMPRESSION: No active cardiopulmonary disease.   Electronically Signed   By: Inez Catalina M.D.   On: 05/20/2014 08:05   Ct Angio Chest Pe W/cm &/or Wo Cm  05/20/2014   CLINICAL DATA:  Elevated D-dimer, acute chest pain, sickle cell anemia  EXAM: CT ANGIOGRAPHY CHEST WITH CONTRAST  TECHNIQUE: Multidetector CT imaging of the chest was performed using the standard protocol during bolus administration of intravenous contrast. Multiplanar CT image reconstructions and MIPs were obtained to evaluate the vascular anatomy.  CONTRAST:  132m OMNIPAQUE IOHEXOL 350 MG/ML SOLN  COMPARISON:  05/20/2014, 05/03/2012  FINDINGS: Pulmonary arteries are well visualized. No significant filling defect or pulmonary embolus demonstrated by CTA. Normal heart  size. No pericardial or pleural effusion. No adenopathy. Small nonenlarged axillary lymph nodes noted. No hiatal hernia.  Included upper abdomen demonstrates prior splenectomy. No acute upper abdominal finding.  Lung windows demonstrate a stable left apical 5 mm nodule, image 10. Minor scattered bibasilar dependent atelectasis. No focal pneumonia, collapse or consolidation. No interstitial process or edema. Negative for pneumothorax. Trachea and central airways remain patent.  No acute osseous finding.  Review of the MIP images confirms the above findings.  IMPRESSION: No significant acute pulmonary embolus by CTA.  Stable 5 mm left upper lobe subpleural pulmonary nodule since 05/03/2012 compatible with a benign nodule.  Minimal bibasilar atelectasis.  No acute intra thoracic finding.   Electronically Signed   By: MJerilynn Mages  Shick M.D.   On: 05/20/2014 10:29   Dg Chest Port 1 View  06/02/2014   CLINICAL DATA:  Right side chest pain, recent pneumonia  EXAM: PORTABLE CHEST - 1 VIEW  COMPARISON:  05/23/2014  FINDINGS: There is patchy infiltrate in right lower lobe suspicious for recurrent pneumonia. No pulmonary edema.  IMPRESSION: Patchy infiltrate in right lower lobe suspicious for recurrent pneumonia. No pulmonary edema.   Electronically Signed   By: LLahoma CrockerM.D.   On: 06/02/2014 12:07    Microbiology: Recent Results (from the past 240 hour(s))  Blood culture (routine x 2)     Status: None (Preliminary result)   Collection Time: 06/02/14 11:12 AM  Result Value Ref Range Status   Specimen Description BLOOD LEFT ANTECUBITAL  Final   Special Requests BOTTLES DRAWN AEROBIC AND ANAEROBIC 5 CC  Final   Culture   Final           BLOOD CULTURE RECEIVED NO GROWTH TO DATE CULTURE WILL BE HELD FOR 5 DAYS BEFORE ISSUING A FINAL NEGATIVE REPORT [facility]   Report Status PENDING  Incomplete  Blood culture (routine x 2)     Status: None (Preliminary result)   Collection Time: 06/02/14 11:38  AM  Result Value Ref Range Status   Specimen Description BLOOD RFA  Final   Special Requests BOTTLES DRAWN AEROBIC AND ANAEROBIC 3CC  Final   Culture   Final           BLOOD CULTURE RECEIVED NO GROWTH TO DATE CULTURE  WILL BE HELD FOR 5 DAYS BEFORE ISSUING A FINAL NEGATIVE REPORT Performed at [facility]    Report Status PENDING  Incomplete  Culture, sputum-assessment     Status: None   Collection Time: 06/06/14  6:18 PM  Result Value Ref Range Status   Specimen Description SPUTUM  Final   Special Requests NONE  Final   Sputum evaluation   Final    MICROSCOPIC FINDINGS SUGGEST THAT THIS SPECIMEN IS NOT REPRESENTATIVE OF LOWER RESPIRATORY SECRETIONS. PLEASE RECOLLECT. CALLED TO ENE ALI RN AT 1912 ON 4.19.16 BY W.BUCHHOLZ.   Report Status 06/06/2014 FINAL  Final     Labs: Basic Metabolic Panel:  Recent Labs Lab 06/02/14 1112 06/03/14 0518 06/06/14 0506 06/07/14 0446  NA 131* 135 137 137  K 4.1 4.3 4.3 4.5  CL 97 102 102 103  CO2 '24 27 28 25  ' GLUCOSE 123* 120* 96 91  BUN 9 6 <5* 6  CREATININE 0.84 0.74 0.68 0.61  CALCIUM 9.3 8.6 8.6 9.0   Liver Function Tests:  Recent Labs Lab 06/02/14 1112 06/03/14 0518  AST 18 15  ALT 15 12  ALKPHOS 76 62  BILITOT 2.1* 1.2  PROT 8.1 6.6  ALBUMIN 4.2 3.3*   No results for input(s): LIPASE, AMYLASE in the last 168 hours. No results for input(s): AMMONIA in the last 168 hours. CBC:  Recent Labs Lab 06/02/14 1112 06/03/14 0518 06/06/14 0506 06/07/14 0446  WBC 22.2* 24.2* 11.8* 8.9  NEUTROABS 18.0*  --   --   --   HGB 8.8* 7.6* 7.5* 8.1*  HCT 26.0* 22.8* 22.1* 24.5*  MCV 93.2 94.6 91.3 92.1  PLT 867* 865* 1259* 1188*   Cardiac Enzymes: No results for input(s): CKTOTAL, CKMB, CKMBINDEX, TROPONINI in the last 168 hours. BNP: BNP (last 3 results) No results for input(s): BNP in the last 8760 hours.  ProBNP (last 3 results) No results for input(s): PROBNP in the last 8760 hours.  CBG:  Recent Labs Lab  06/03/14 0846 06/04/14 0758 06/05/14 0733  GLUCAP 112* 97 113*       SignedDebbe Odea, MD [facility] 06/07/2014, 12:49 PM

## 2014-06-08 LAB — CULTURE, BLOOD (ROUTINE X 2)
CULTURE: NO GROWTH
Culture: NO GROWTH

## 2015-08-07 ENCOUNTER — Ambulatory Visit (INDEPENDENT_AMBULATORY_CARE_PROVIDER_SITE_OTHER): Payer: Managed Care, Other (non HMO) | Admitting: Family Medicine

## 2015-08-07 ENCOUNTER — Encounter: Payer: Self-pay | Admitting: Family Medicine

## 2015-08-07 VITALS — BP 127/68 | HR 74 | Temp 98.3°F | Resp 20 | Ht 69.0 in | Wt 144.0 lb

## 2015-08-07 DIAGNOSIS — D571 Sickle-cell disease without crisis: Secondary | ICD-10-CM

## 2015-08-07 LAB — FERRITIN: Ferritin: 94 ng/mL (ref 20–345)

## 2015-08-07 LAB — POCT URINALYSIS DIP (DEVICE)
BILIRUBIN URINE: NEGATIVE
GLUCOSE, UA: NEGATIVE mg/dL
HGB URINE DIPSTICK: NEGATIVE
Ketones, ur: NEGATIVE mg/dL
LEUKOCYTES UA: NEGATIVE
NITRITE: NEGATIVE
Protein, ur: NEGATIVE mg/dL
Specific Gravity, Urine: 1.01 (ref 1.005–1.030)
Urobilinogen, UA: 1 mg/dL (ref 0.0–1.0)
pH: 6 (ref 5.0–8.0)

## 2015-08-07 LAB — COMPLETE METABOLIC PANEL WITH GFR
ALBUMIN: 4.6 g/dL (ref 3.6–5.1)
ALK PHOS: 47 U/L (ref 40–115)
ALT: 12 U/L (ref 9–46)
AST: 22 U/L (ref 10–40)
BILIRUBIN TOTAL: 1.4 mg/dL — AB (ref 0.2–1.2)
BUN: 8 mg/dL (ref 7–25)
CO2: 24 mmol/L (ref 20–31)
Calcium: 9.3 mg/dL (ref 8.6–10.3)
Chloride: 104 mmol/L (ref 98–110)
Creat: 0.84 mg/dL (ref 0.60–1.35)
GFR, Est African American: >89 mL/min (ref >=60)
GLUCOSE: 92 mg/dL (ref 65–99)
Potassium: 4.3 mmol/L (ref 3.5–5.3)
Sodium: 140 mmol/L (ref 135–146)
Total Protein: 7.3 g/dL (ref 6.1–8.1)

## 2015-08-07 LAB — RETICULOCYTES
ABS Retic: 341420 cells/uL — ABNORMAL HIGH (ref 25000–90000)
RBC.: 3.97 MIL/uL — AB (ref 4.20–5.80)
Retic Ct Pct: 8.6 %

## 2015-08-07 LAB — CBC WITH DIFFERENTIAL/PLATELET
BASOS ABS: 78 {cells}/uL (ref 0–200)
Basophils Relative: 1 %
EOS ABS: 234 {cells}/uL (ref 15–500)
Eosinophils Relative: 3 %
HEMATOCRIT: 36.7 % — AB (ref 38.5–50.0)
HEMOGLOBIN: 12.5 g/dL — AB (ref 13.2–17.1)
LYMPHS ABS: 2418 {cells}/uL (ref 850–3900)
Lymphocytes Relative: 31 %
MCH: 31.5 pg (ref 27.0–33.0)
MCHC: 34.1 g/dL (ref 32.0–36.0)
MCV: 92.4 fL (ref 80.0–100.0)
MPV: 12 fL (ref 7.5–12.5)
Monocytes Absolute: 858 cells/uL (ref 200–950)
Monocytes Relative: 11 %
Neutro Abs: 4212 cells/uL (ref 1500–7800)
Neutrophils Relative %: 54 %
Platelets: 239 10*3/uL (ref 140–400)
RBC: 3.97 MIL/uL — ABNORMAL LOW (ref 4.20–5.80)
RDW: 17.4 % — ABNORMAL HIGH (ref 11.0–15.0)
WBC: 7.8 10*3/uL (ref 3.8–10.8)

## 2015-08-07 MED ORDER — HYDROXYUREA 500 MG PO CAPS: 1500.0000 mg | ORAL_CAPSULE | Freq: Every day | ORAL | Status: DC

## 2015-08-07 MED ORDER — FOLIC ACID 1 MG PO TABS: 1.0000 mg | ORAL_TABLET | Freq: Every day | ORAL | Status: DC

## 2015-08-07 NOTE — Patient Instructions (Addendum)
Will notify patient with laboratory results Will review [place] Immunization Registry   [facility]: M-F 8am-5pm [phone]   Sickle Cell Anemia, Adult Sickle cell anemia is a condition in which red blood cells have an abnormal "sickle" shape. This abnormal shape shortens the cells' life span, which results in a lower than normal concentration of red blood cells in the blood. The sickle shape also causes the cells to clump together and block free blood flow through the blood vessels. As a result, the tissues and organs of the body do not receive enough oxygen. Sickle cell anemia causes organ damage and pain and increases the risk of infection. CAUSES  Sickle cell anemia is a genetic disorder. Those who receive two copies of the gene have the condition, and those who receive one copy have the trait. RISK FACTORS The sickle cell gene is most common in people whose families originated in [place]. Other areas of the globe where sickle cell trait occurs include the [place], [place] and [place], and the [place].  SIGNS AND SYMPTOMS  Pain, especially in the extremities, back, chest, or abdomen (common). The pain may start suddenly or may develop following an illness, especially if there is dehydration. Pain can also occur due to overexertion or exposure to extreme temperature changes.  Frequent severe bacterial infections, especially certain types of pneumonia and meningitis.  Pain and swelling in the hands and feet.  Decreased activity.   Loss of appetite.   Change in behavior.  Headaches.  Seizures.  Shortness of breath or difficulty breathing.  Vision changes.  Skin ulcers. Those with the trait may not have symptoms or they may have mild symptoms.  DIAGNOSIS  Sickle cell anemia is diagnosed with blood tests that demonstrate the genetic trait. It is often diagnosed during the newborn period, due to mandatory testing nationwide. A variety of  blood tests, X-rays, CT scans, MRI scans, ultrasounds, and lung function tests may also be done to monitor the condition. TREATMENT  Sickle cell anemia may be treated with:  Medicines. You may be given pain medicines, antibiotic medicines (to treat and prevent infections) or medicines to increase the production of certain types of hemoglobin.  Fluids.  Oxygen.  Blood transfusions. HOME CARE INSTRUCTIONS   Drink enough fluid to keep your urine clear or pale yellow. Increase your fluid intake in hot weather and during exercise.  Do not smoke. Smoking lowers oxygen levels in the blood.   Only take over-the-counter or prescription medicines for pain, fever, or discomfort as directed by your health care provider.  Take antibiotics as directed by your health care provider. Make sure you finish them it even if you start to feel better.   Take supplements as directed by your health care provider.   Consider wearing a medical alert bracelet. This tells anyone caring for you in an emergency of your condition.   When traveling, keep your medical information, health care provider's names, and the medicines you take with you at all times.   If you develop a fever, do not take medicines to reduce the fever right away. This could cover up a problem that is developing. Notify your health care provider.  Keep all follow-up appointments with your health care provider. Sickle cell anemia requires regular medical care. SEEK MEDICAL CARE IF: You have a fever. SEEK IMMEDIATE MEDICAL CARE IF:   You feel dizzy or faint.   You have new abdominal pain, especially on the left side near the  stomach area.   You develop a persistent, often uncomfortable and painful penile erection (priapism). If this is not treated immediately it will lead to impotence.   You have numbness your arms or legs or you have a hard time moving them.   You have a hard time with speech.   You have a fever or  persistent symptoms for more than 2-3 days.   You have a fever and your symptoms suddenly get worse.   You have signs or symptoms of infection. These include:   Chills.   Abnormal tiredness (lethargy).   Irritability.   Poor eating.   Vomiting.   You develop pain that is not helped with medicine.   You develop shortness of breath.  You have pain in your chest.   You are coughing up pus-like or bloody sputum.   You develop a stiff neck.  Your feet or hands swell or have pain.  Your abdomen appears bloated.  You develop joint pain. MAKE SURE YOU:  Understand these instructions.   This information is not intended to replace advice given to you by your health care provider. Make sure you discuss any questions you have with your health care provider.   Document Released: 05/14/2005 Document Revised: 02/24/2014 Document Reviewed: 09/15/2012 Elsevier Interactive Patient Education [employer].

## 2015-08-07 NOTE — Progress Notes (Signed)
Subjective:    Patient ID: Jason Franklin, male    DOB: 1995/03/11, 20 y.o.   MRN: [id number]  HPI Jason Franklin, a 20 year old male with a history of sickle cell anemia, HbSS. He was a patient of Dr. Iona Beard Onsei-Bonsu at [place]. Mr. Hipskind says that he has been lost to follow-up over the past several months. He says that he was at [facility], Dr. Manuella Ghazi, he has not followed with adult hematology as scheduled. He says that he feels well and is without complaint. He does not take opiate medications for pain management. He says that he typically takes Ibuprofen for mild to moderate pain. He denies headache, shortness of breath, chest pain, dysuria, nausea, vomiting, or diarrhea.   Past Medical History  Diagnosis Date  . Asthma   . Sickle cell disease ([place])    Immunization History  Administered Date(s) Administered  . Influenza,inj,Quad PF,36+ Mos 03/04/2014    Social History   Social History  . Marital Status: Single    Spouse Name: N/A  . Number of Children: N/A  . Years of Education: N/A   Occupational History  . Not on file.   Social History Main Topics  . Smoking status: Never Smoker   . Smokeless tobacco: Former Systems developer  . Alcohol Use: Yes     Comment: occassional  . Drug Use: No  . Sexual Activity: Yes    Birth Control/ Protection: Condom   Other Topics Concern  . Not on file   Social History Narrative  No Known Allergies Review of Systems  Constitutional: Negative.   HENT: Negative.   Eyes: Negative.   Cardiovascular: Negative.  Negative for chest pain, palpitations and leg swelling.  Endocrine: Negative.  Negative for polydipsia, polyphagia and polyuria.  Genitourinary: Negative.   Musculoskeletal: Negative.   Skin: Negative.   Allergic/Immunologic: Negative.   Neurological: Negative.   Psychiatric/Behavioral: Negative.       Objective:   Physical Exam  Constitutional: He is oriented to person, place, and time. He appears  well-developed.  HENT:  Head: Normocephalic and atraumatic.  Right Ear: External ear normal.  Left Ear: External ear normal.  Nose: Nose normal.  Mouth/Throat: Oropharynx is clear and moist.  Eyes: Conjunctivae and EOM are normal. Pupils are equal, round, and reactive to light.  Neck: Normal range of motion. Neck supple.  Cardiovascular: Normal rate, normal heart sounds and intact distal pulses.   Pulmonary/Chest: Effort normal and breath sounds normal.  Abdominal: Soft. Bowel sounds are normal.  Neurological: He is alert and oriented to person, place, and time. He has normal reflexes.  Skin: Skin is warm and dry.  Psychiatric: He has a normal mood and affect. His behavior is normal. Judgment and thought content normal.     BP 127/68 mmHg  Pulse 74  Temp(Src) 98.3 F (36.8 C) (Oral)  Resp 20  Ht 5\' 9"  (1.753 m)  Wt 144 lb (65.318 kg)  BMI 21.26 kg/m2  SpO2 98% Assessment & Plan:  1. Hb-SS disease without crisis ([place])  Sickle cell disease - Continue Hydrea 1500 mg. Will check Reticulocytes, platelet count, and ANC.  We discussed the need for good hydration, monitoring of hydration status, avoidance of heat, cold, stress, and infection triggers. We discussed the risks and benefits of Hydrea, including bone marrow suppression, the possibility of GI upset, skin ulcers, hair thinning, and teratogenicity. The patient was reminded of the need to seek medical attention of any symptoms of bleeding,  anemia, or infection. Continue folic acid 1 mg daily to prevent aplastic bone marrow crises.   Pulmonary evaluation - Patient denies severe recurrent wheezes, shortness of breath with exercise, or persistent cough. If these symptoms develop, pulmonary function tests with spirometry will be ordered, and if abnormal, plan on referral to Pulmonology for further evaluation.   Cardiac - Routine screening for pulmonary hypertension is not recommended.  Eye - High risk of proliferative retinopathy. Annual  eye exam with retinal exam recommended to patient.  Immunization status - Will review [place] immunization registry.   Patient only uses OTC Ibuprofen for pain management.    - Ambulatory referral to Ophthalmology - hydroxyurea (HYDREA) 500 MG capsule; Take 3 capsules (1,500 mg total) by mouth daily. May take with food to minimize GI side effects.  Dispense: 90 capsule; Refill: 5 - folic acid (FOLVITE) 1 MG tablet; Take 1 tablet (1 mg total) by mouth daily. - Vitamin D, 25-hydroxy - Ferritin - CBC with Differential - Hemoglobinopathy evaluation - Reticulocytes - COMPLETE METABOLIC PANEL WITH GFR    RTC: 3 months for sickle cell anemia   Rekha Hobbins M, FNP

## 2015-08-08 LAB — VITAMIN D 25 HYDROXY (VIT D DEFICIENCY, FRACTURES): Vit D, 25-Hydroxy: 24 ng/mL — ABNORMAL LOW (ref 30–100)

## 2015-11-27 ENCOUNTER — Ambulatory Visit (INDEPENDENT_AMBULATORY_CARE_PROVIDER_SITE_OTHER): Payer: Managed Care, Other (non HMO) | Admitting: Internal Medicine

## 2015-11-27 ENCOUNTER — Encounter: Payer: Self-pay | Admitting: Internal Medicine

## 2015-11-27 VITALS — BP 110/54 | HR 75 | Temp 98.2°F | Resp 18 | Ht 68.0 in | Wt 151.0 lb

## 2015-11-27 DIAGNOSIS — D571 Sickle-cell disease without crisis: Secondary | ICD-10-CM

## 2015-11-27 MED ORDER — FOLIC ACID 1 MG PO TABS: 1.0000 mg | ORAL_TABLET | Freq: Every day | ORAL | 3 refills | Status: DC

## 2015-11-27 MED ORDER — HYDROXYUREA 500 MG PO CAPS: 1500.0000 mg | ORAL_CAPSULE | Freq: Every day | ORAL | 5 refills | Status: DC

## 2015-11-27 NOTE — Progress Notes (Signed)
Jason Franklin, is a 20 y.o. male  [id number]  [id number]  DOB - [birth year]  Chief Complaint  Patient presents with  . Follow-up       Subjective:   Jason Franklin is a 20 y.o. male with history of sickle cell disease, HbSS here today for a follow up visit and medication refill. Patient claims he's doing well, has no complaint. He does not have pain, not on any narcotic pain medication. He takes ibuprofen prn for pain. Patient has No headache, No chest pain, No abdominal pain - No Nausea, No new weakness tingling or numbness, No Cough - SOB.  Problem  Hcap (Healthcare-Associated Pneumonia) (Resolved)  Sepsis ([place]) (Resolved)  Sepsis due to pneumonia ([place]) (Resolved)    ALLERGIES: No Known Allergies  PAST MEDICAL HISTORY: Past Medical History:  Diagnosis Date  . Asthma   . Sickle cell disease ([place])     MEDICATIONS AT HOME: Prior to Admission medications   Medication Sig Start Date End Date Taking? Authorizing Provider  albuterol (PROVENTIL HFA;VENTOLIN HFA) 108 (90 BASE) MCG/ACT inhaler Inhale 2 puffs into the lungs every 4 (four) hours as needed for wheezing or shortness of breath. 05/23/14  Yes Leana Gamer, MD  hydroxyurea (HYDREA) 500 MG capsule Take 3 capsules (1,500 mg total) by mouth daily. May take with food to minimize GI side effects. 11/27/15  Yes Tresa Garter, MD  folic acid (FOLVITE) 1 MG tablet Take 1 tablet (1 mg total) by mouth daily. 11/27/15   Tresa Garter, MD    Objective:   Vitals:   11/27/15 0839  BP: (!) 110/54  Pulse: 75  Resp: 18  Temp: 98.2 F (36.8 C)  TempSrc: Oral  SpO2: 100%  Weight: 151 lb (68.5 kg)  Height: 5\' 8"  (1.727 m)   Exam General appearance : Awake, alert, not in any distress. Speech Clear. Not toxic looking HEENT: Atraumatic and Normocephalic, pupils equally reactive to light and accomodation Neck: Supple, no JVD. No cervical lymphadenopathy.  Chest: Good air entry bilaterally, no added sounds   CVS: S1 S2 regular, no murmurs.  Abdomen: Bowel sounds present, Non tender and not distended with no gaurding, rigidity or rebound. Extremities: B/L Lower Ext shows no edema, both legs are warm to touch Neurology: Awake alert, and oriented X 3, CN II-XII intact, Non focal Skin: No Rash  Data Review No results found for: HGBA1C  Assessment & Plan   1. Hb-SS disease without crisis ([place])  - hydroxyurea (HYDREA) 500 MG capsule; Take 3 capsules (1,500 mg total) by mouth daily. May take with food to minimize GI side effects.  Dispense: 90 capsule; Refill: 5 - folic acid (FOLVITE) 1 MG tablet; Take 1 tablet (1 mg total) by mouth daily.  Dispense: 90 tablet; Refill: 3  Continue Hydrea. We discussed the need for good hydration, monitoring of hydration status, avoidance of heat, cold, stress, and infection triggers. We discussed the risks and benefits of Hydrea, including bone marrow suppression, the possibility of GI upset, skin ulcers, hair thinning, and teratogenicity. The patient was reminded of the need to seek medical attention of any symptoms of bleeding, anemia, or infection. Continue folic acid 1 mg daily to prevent aplastic bone marrow crises.   Pulmonary evaluation - Patient denies severe recurrent wheezes, shortness of breath with exercise, or persistent cough. If these symptoms develop, pulmonary function tests with spirometry will be ordered, and if abnormal, plan on referral to Pulmonology for further evaluation.  Cardiac - Routine screening for  pulmonary hypertension is not recommended.  Eye - High risk of proliferative retinopathy. Annual eye exam with retinal exam recommended to patient, the patient has had eye exam this year.  Immunization status - Yearly influenza vaccination is recommended, as well as being up to date with Meningococcal and Pneumococcal vaccines.   Return in about 3 months (around 02/27/2016) for Sickle Cell Disease/Pain, Lab draw.  The patient was given clear  instructions to go to ER or return to medical center if symptoms don't improve, worsen or new problems develop. The patient verbalized understanding. The patient was told to call to get lab results if they haven't heard anything in the next week.   This note has been created with Surveyor, quantity. Any transcriptional errors are unintentional.    Angelica Chessman, MD, [place], [place], [place], [place] and [place], [place]   11/27/2015, 9:03 AM

## 2015-11-27 NOTE — Patient Instructions (Signed)
Sickle Cell Anemia, Adult Sickle cell anemia is a condition in which red blood cells have an abnormal "sickle" shape. This abnormal shape shortens the cells' life span, which results in a lower than normal concentration of red blood cells in the blood. The sickle shape also causes the cells to clump together and block free blood flow through the blood vessels. As a result, the tissues and organs of the body do not receive enough oxygen. Sickle cell anemia causes organ damage and pain and increases the risk of infection. CAUSES  Sickle cell anemia is a genetic disorder. Those who receive two copies of the gene have the condition, and those who receive one copy have the trait. RISK FACTORS The sickle cell gene is most common in people whose families originated in [place]. Other areas of the globe where sickle cell trait occurs include the Mediterranean, [place] and [place], the [place], and the [place].  SIGNS AND SYMPTOMS  Pain, especially in the extremities, back, chest, or abdomen (common). The pain may start suddenly or may develop following an illness, especially if there is dehydration. Pain can also occur due to overexertion or exposure to extreme temperature changes.  Frequent severe bacterial infections, especially certain types of pneumonia and meningitis.  Pain and swelling in the hands and feet.  Decreased activity.   Loss of appetite.   Change in behavior.  Headaches.  Seizures.  Shortness of breath or difficulty breathing.  Vision changes.  Skin ulcers. Those with the trait may not have symptoms or they may have mild symptoms.  DIAGNOSIS  Sickle cell anemia is diagnosed with blood tests that demonstrate the genetic trait. It is often diagnosed during the newborn period, due to mandatory testing nationwide. A variety of blood tests, X-rays, CT scans, MRI scans, ultrasounds, and lung function tests may also be done to monitor the condition. TREATMENT  Sickle  cell anemia may be treated with:  Medicines. You may be given pain medicines, antibiotic medicines (to treat and prevent infections) or medicines to increase the production of certain types of hemoglobin.  Fluids.  Oxygen.  Blood transfusions. HOME CARE INSTRUCTIONS   Drink enough fluid to keep your urine clear or pale yellow. Increase your fluid intake in hot weather and during exercise.  Do not smoke. Smoking lowers oxygen levels in the blood.   Only take over-the-counter or prescription medicines for pain, fever, or discomfort as directed by your health care provider.  Take antibiotics as directed by your health care provider. Make sure you finish them it even if you start to feel better.   Take supplements as directed by your health care provider.   Consider wearing a medical alert bracelet. This tells anyone caring for you in an emergency of your condition.   When traveling, keep your medical information, health care provider's names, and the medicines you take with you at all times.   If you develop a fever, do not take medicines to reduce the fever right away. This could cover up a problem that is developing. Notify your health care provider.  Keep all follow-up appointments with your health care provider. Sickle cell anemia requires regular medical care. SEEK MEDICAL CARE IF: You have a fever. SEEK IMMEDIATE MEDICAL CARE IF:   You feel dizzy or faint.   You have new abdominal pain, especially on the left side near the stomach area.   You develop a persistent, often uncomfortable and painful penile erection (priapism). If this is not treated immediately it   will lead to impotence.   You have numbness your arms or legs or you have a hard time moving them.   You have a hard time with speech.   You have a fever or persistent symptoms for more than 2-3 days.   You have a fever and your symptoms suddenly get worse.   You have signs or symptoms of infection.  These include:   Chills.   Abnormal tiredness (lethargy).   Irritability.   Poor eating.   Vomiting.   You develop pain that is not helped with medicine.   You develop shortness of breath.  You have pain in your chest.   You are coughing up pus-like or bloody sputum.   You develop a stiff neck.  Your feet or hands swell or have pain.  Your abdomen appears bloated.  You develop joint pain. MAKE SURE YOU:  Understand these instructions.   This information is not intended to replace advice given to you by your health care provider. Make sure you discuss any questions you have with your health care provider.   Document Released: 05/14/2005 Document Revised: 02/24/2014 Document Reviewed: 09/15/2012 Elsevier Interactive Patient Education 2016 Elsevier Inc.  

## 2015-11-27 NOTE — Progress Notes (Signed)
Patient is here for FU  Patient has taken medication today and patient has not eaten today.  Patient denies pain at this time.  Patient tolerated flu vaccine well today.

## 2016-03-10 ENCOUNTER — Encounter: Payer: Self-pay | Admitting: Family Medicine

## 2016-03-10 ENCOUNTER — Ambulatory Visit (INDEPENDENT_AMBULATORY_CARE_PROVIDER_SITE_OTHER): Payer: Managed Care, Other (non HMO) | Admitting: Family Medicine

## 2016-03-10 VITALS — BP 134/76 | HR 88 | Temp 98.5°F | Resp 14 | Ht 69.0 in | Wt 149.0 lb

## 2016-03-10 DIAGNOSIS — Z23 Encounter for immunization: Secondary | ICD-10-CM

## 2016-03-10 DIAGNOSIS — Z202 Contact with and (suspected) exposure to infections with a predominantly sexual mode of transmission: Secondary | ICD-10-CM | POA: Diagnosis not present

## 2016-03-10 DIAGNOSIS — D571 Sickle-cell disease without crisis: Secondary | ICD-10-CM

## 2016-03-10 LAB — CBC WITH DIFFERENTIAL/PLATELET
Basophils Absolute: 73 cells/uL (ref 0–200)
Basophils Relative: 1 %
Eosinophils Absolute: 146 cells/uL (ref 15–500)
Eosinophils Relative: 2 %
HCT: 37.4 % — ABNORMAL LOW (ref 38.5–50.0)
Hemoglobin: 12.5 g/dL — ABNORMAL LOW (ref 13.2–17.1)
Lymphocytes Relative: 30 %
Lymphs Abs: 2190 cells/uL (ref 850–3900)
MCH: 36 pg — ABNORMAL HIGH (ref 27.0–33.0)
MCHC: 33.4 g/dL (ref 32.0–36.0)
MCV: 107.8 fL — ABNORMAL HIGH (ref 80.0–100.0)
MPV: 10.6 fL (ref 7.5–12.5)
Monocytes Absolute: 584 cells/uL (ref 200–950)
Monocytes Relative: 8 %
NEUTROS PCT: 59 %
Neutro Abs: 4307 cells/uL (ref 1500–7800)
Platelets: 322 10*3/uL (ref 140–400)
RBC: 3.47 MIL/uL — AB (ref 4.20–5.80)
RDW: 21.2 % — ABNORMAL HIGH (ref 11.0–15.0)
WBC: 7.3 10*3/uL (ref 3.8–10.8)

## 2016-03-10 LAB — COMPLETE METABOLIC PANEL WITH GFR
ALBUMIN: 5.1 g/dL (ref 3.6–5.1)
ALK PHOS: 50 U/L (ref 40–115)
ALT: 17 U/L (ref 9–46)
AST: 24 U/L (ref 10–40)
BUN: 10 mg/dL (ref 7–25)
CO2: 25 mmol/L (ref 20–31)
Calcium: 10.1 mg/dL (ref 8.6–10.3)
Chloride: 104 mmol/L (ref 98–110)
Creat: 0.78 mg/dL (ref 0.60–1.35)
GFR, Est African American: >89 mL/min (ref >=60)
GFR, Est Non African American: >89 mL/min (ref >=60)
Glucose, Bld: 69 mg/dL (ref 65–99)
POTASSIUM: 4.5 mmol/L (ref 3.5–5.3)
Sodium: 139 mmol/L (ref 135–146)
Total Bilirubin: 1.4 mg/dL — ABNORMAL HIGH (ref 0.2–1.2)
Total Protein: 7.7 g/dL (ref 6.1–8.1)

## 2016-03-10 LAB — RETICULOCYTES
ABS Retic: 145740 cells/uL — ABNORMAL HIGH (ref 25000–90000)
RBC.: 3.47 MIL/uL — AB (ref 4.20–5.80)
Retic Ct Pct: 4.2 %

## 2016-03-10 LAB — HIV ANTIBODY (ROUTINE TESTING W REFLEX): HIV 1&2 Ab, 4th Generation: NONREACTIVE

## 2016-03-10 LAB — POCT URINALYSIS DIP (DEVICE)
BILIRUBIN URINE: NEGATIVE
Glucose, UA: NEGATIVE mg/dL
Hgb urine dipstick: NEGATIVE
KETONES UR: NEGATIVE mg/dL
Leukocytes, UA: NEGATIVE
Nitrite: NEGATIVE
PH: 6.5 (ref 5.0–8.0)
Protein, ur: NEGATIVE mg/dL
Specific Gravity, Urine: 1.015 (ref 1.005–1.030)
Urobilinogen, UA: 1 mg/dL (ref 0.0–1.0)

## 2016-03-10 NOTE — Progress Notes (Signed)
Subjective:    Patient ID: Jason Franklin, male    DOB: 07-20-1995, 21 y.o.   MRN: [id number]  HPI [name], a 21 year old male with a history of sickle cell anemia, HbSS. He says that he feels well and is without complaint. He does not take opiate medications for pain management. He says that he typically takes Ibuprofen for mild to moderate pain. Patient is taking hydroxyurea 1500 mg consistently.  He denies headache, shortness of breath, chest pain, dysuria, nausea, vomiting, or diarrhea.   Past Medical History:  Diagnosis Date  . Asthma   . Sickle cell disease ([place])    Immunization History  Administered Date(s) Administered  . DTP 06/12/1993  . DTaP 06/12/1993, 08/14/1993, 10/09/1993, 08/13/1994  . Hepatitis B 06/18/1993, 10/09/1993  . HiB (PRP-OMP) 06/12/1993, 08/14/1993, 10/09/1993  . Influenza,inj,Quad PF,36+ Mos 03/04/2014, 11/27/2015  . MMR 10/15/1994  . Pneumococcal Polysaccharide-23 07/30/1994  . Td 08/12/2004  . Tdap 04/08/2004    Social History   Social History  . Marital status: Single    Spouse name: N/A  . Number of children: N/A  . Years of education: N/A   Occupational History  . Not on file.   Social History Main Topics  . Smoking status: Never Smoker  . Smokeless tobacco: Former Systems developer     Comment: vapor   . Alcohol use Yes     Comment: occassional  . Drug use: Yes    Types: Marijuana  . Sexual activity: Yes    Birth control/ protection: Condom   Other Topics Concern  . Not on file   Social History Narrative  . No narrative on file  No Known Allergies Review of Systems  Constitutional: Negative.   HENT: Negative.   Eyes: Negative.   Cardiovascular: Negative.  Negative for chest pain, palpitations and leg swelling.  Endocrine: Negative.  Negative for polydipsia, polyphagia and polyuria.  Genitourinary: Negative.   Musculoskeletal: Negative.   Skin: Negative.   Allergic/Immunologic: Negative.   Neurological: Negative.    Psychiatric/Behavioral: Negative.       Objective:   Physical Exam  Constitutional: He is oriented to person, place, and time. He appears well-developed.  HENT:  Head: Normocephalic and atraumatic.  Right Ear: External ear normal.  Left Ear: External ear normal.  Nose: Nose normal.  Mouth/Throat: Oropharynx is clear and moist.  Eyes: Conjunctivae and EOM are normal. Pupils are equal, round, and reactive to light.  Neck: Normal range of motion. Neck supple.  Cardiovascular: Normal rate, normal heart sounds and intact distal pulses.   Pulmonary/Chest: Effort normal and breath sounds normal.  Abdominal: Soft. Bowel sounds are normal.  Neurological: He is alert and oriented to person, place, and time. He has normal reflexes.  Skin: Skin is warm and dry.  Psychiatric: He has a normal mood and affect. His behavior is normal. Judgment and thought content normal.     BP 134/76 (BP Location: Right Arm, Patient Position: Sitting, Cuff Size: Normal)   Pulse 88   Temp 98.5 F (36.9 C) (Oral)   Resp 14   Ht '5\' 9"'  (1.753 m)   Wt 149 lb (67.6 kg)   SpO2 98%   BMI 22.00 kg/m  Assessment & Plan:  1. Hb-SS disease without crisis ([place])  Sickle cell disease - Continue Hydrea 1500 mg. Will check Reticulocytes, platelet count, and ANC.  We discussed the need for good hydration, monitoring of hydration status, avoidance of heat, cold, stress, and infection triggers. We  discussed the risks and benefits of Hydrea, including bone marrow suppression, the possibility of GI upset, skin ulcers, hair thinning, and teratogenicity. The patient was reminded of the need to seek medical attention of any symptoms of bleeding, anemia, or infection. Continue folic acid 1 mg daily to prevent aplastic bone marrow crises.   Pulmonary evaluation - Patient denies severe recurrent wheezes, shortness of breath with exercise, or persistent cough. If these symptoms develop, pulmonary function tests with spirometry will be  ordered, and if abnormal, plan on referral to Pulmonology for further evaluation.   Cardiac - Routine screening for pulmonary hypertension is not recommended.  Eye - High risk of proliferative retinopathy. Annual eye exam with retinal exam recommended to patient.  Immunization status - Will review [place] immunization registry.   Patient only uses OTC Ibuprofen for pain management.  - COMPLETE METABOLIC PANEL WITH GFR - CBC with Differential - Reticulocytes - Ambulatory referral to Ophthalmology  2. Possible exposure to STD Recommend barrier protection with sexual intercourse - HIV antibody (with reflex) - RPR - GC/Chlamydia Probe Amp  3. Need for Tdap vaccination - Tdap vaccine greater than or equal to 7yo IM  RTC: 6 months for sickle cell anemia   Dorena Dew, FNP

## 2016-03-10 NOTE — Patient Instructions (Addendum)
Sickle Cell Anemia, Adult Sickle cell anemia is a condition where your red blood cells are shaped like sickles. Red blood cells carry oxygen through the body. Sickle-shaped red blood cells do not live as long as normal red blood cells. They also clump together and block blood from flowing through the blood vessels. These things prevent the body from getting enough oxygen. Sickle cell anemia causes organ damage and pain. It also increases the risk of infection. Follow these instructions at home:  Drink enough fluid to keep your pee (urine) clear or pale yellow. Drink more in hot weather and during exercise.  Do not smoke. Smoking lowers oxygen levels in the blood.  Only take over-the-counter or prescription medicines as told by your doctor.  Take antibiotic medicines as told by your doctor. Make sure you finish them even if you start to feel better.  Take supplements as told by your doctor.  Consider wearing a medical alert bracelet. This tells anyone caring for you in an emergency of your condition.  When traveling, keep your medical information, doctors' names, and the medicines you take with you at all times.  If you have a fever, do not take fever medicines right away. This could cover up a problem. Tell your doctor.  Keep all follow-up visits with your doctor. Sickle cell anemia requires regular medical care. Contact a doctor if: You have a fever. Get help right away if:  You feel dizzy or faint.  You have new belly (abdominal) pain, especially on the left side near the stomach area.  You have a lasting, often uncomfortable and painful erection of the penis (priapism). If it is not treated right away, you will become unable to have sex (impotence).  You have numbness in your arms or legs or you have a hard time moving them.  You have a hard time talking.  You have a fever or lasting symptoms for more than 2-3 days.  You have a fever and your symptoms suddenly get  worse.  You have signs or symptoms of infection. These include:  Chills.  Being more tired than normal (lethargy).  Irritability.  Poor eating.  Throwing up (vomiting).  You have pain that is not helped with medicine.  You have shortness of breath.  You have pain in your chest.  You are coughing up pus-like or bloody mucus.  You have a stiff neck.  Your feet or hands swell or have pain.  Your belly looks bloated.  Your joints hurt. This information is not intended to replace advice given to you by your health care provider. Make sure you discuss any questions you have with your health care provider. Document Released: 11/24/2012 Document Revised: 07/12/2015 Document Reviewed: 09/15/2012 Elsevier Interactive Patient Education  2017 [employer]. Hydroxyurea capsules What is this medicine? HYDROXYUREA (hye drox ee yoor EE a) is a chemotherapy drug. This medicine is used to treat certain types of leukemias and head and neck cancer. It is also used to control the painful crises of sickle cell anemia. This medicine may be used for other purposes; ask your health care provider or pharmacist if you have questions. COMMON BRAND NAME(S): Droxia, Hydrea What should I tell my health care provider before I take this medicine? They need to know if you have any of these conditions: -HIV or AIDS -kidney disease or on hemodialysis -liver disease -low blood counts, like low white cell, platelet, or red cell counts -prior or current interferon therapy -recent or ongoing radiation therapy -an  unusual or allergic reaction to hydroxyurea, other chemotherapy, other medicines, foods, dyes, or preservatives -pregnant or trying to get pregnant -breast-feeding How should I use this medicine? Take this medicine by mouth with a glass of water. Follow the directions on the prescription label. Take your medicine at regular intervals. Do not take it more often than directed. Do not stop taking  except on your doctor's advice. People who are not taking this medicine should not be exposed to it. Wash your hands before and after handling your bottle or medicine. Caregivers should wear disposable gloves if they must touch the bottle or medicine. Clean up any medicine powder that spills with a damp disposable towel and throw the towel away in a closed container, such as a plastic bag. Talk to your pediatrician regarding the use of this medicine in children. Special care may be needed. Overdosage: If you think you have taken too much of this medicine contact a poison control center or emergency room at once. NOTE: This medicine is only for you. Do not share this medicine with others. What if I miss a dose? If you miss a dose, take it as soon as you can. If it is almost time for your next dose, take only that dose. Do not take double or extra doses. What may interact with this medicine? This medicine may also interact with the following medications: -didanosine -stavudine -live virus vaccines This list may not describe all possible interactions. Give your health care provider a list of all the medicines, herbs, non-prescription drugs, or dietary supplements you use. Also tell them if you smoke, drink alcohol, or use illegal drugs. Some items may interact with your medicine. What should I watch for while using this medicine? This drug may make you feel generally unwell. This is not uncommon, as chemotherapy can affect healthy cells as well as cancer cells. Report any side effects. Continue your course of treatment even though you feel ill unless your doctor tells you to stop. You will receive regular blood tests during your treatment. Call your doctor or health care professional for advice if you get a fever, chills or sore throat, or other symptoms of a cold or flu. Do not treat yourself. This drug decreases your body's ability to fight infections. Try to avoid being around people who are  sick. This medicine may increase your risk to bruise or bleed. Call your doctor or health care professional if you notice any unusual bleeding. Talk to your doctor about your risk of cancer. You may be more at risk for certain types of cancers if you take this medicine. You may need blood work done while you are taking this medicine. Do not become pregnant while taking this medicine or for at least 6 months after stopping it. Women should inform their doctor if they wish to become pregnant or think they might be pregnant. Men should not father a child while taking this medicine and for at least a year after stopping it. There is a potential for serious side effects to an unborn child. Talk to your health care professional or pharmacist for more information. Do not breast-feed an infant while taking this medicine. This may interfere with the ability to have or father a child. You should talk with your doctor or health care professional if you are concerned about your fertility. What side effects may I notice from receiving this medicine? Side effects that you should report to your doctor or health care professional as soon as possible: -  allergic reactions like skin rash, itching or hives, swelling of the face, lips, or tongue -low blood counts - this medicine may decrease the number of white blood cells, red blood cells and platelets. You may be at increased risk for infections and bleeding. -signs of infection - fever or chills, cough, sore throat, pain or difficulty passing urine -signs of decreased platelets or bleeding - bruising, pinpoint red spots on the skin, black, tarry stools, blood in the urine -signs of decreased red blood cells - unusually weak or tired, fainting spells, lightheadedness -breathing problems -burning, redness or pain at the site of any radiation therapy -changes in skin color -confusion -mouth sores -pain, tingling, numbness in the hands or feet -seizures -skin  ulcers -trouble passing urine or change in the amount of urine -vomiting Side effects that usually do not require medical attention (report to your doctor or health care professional if they continue or are bothersome): -headache -loss of appetite -red color to the face This list may not describe all possible side effects. Call your doctor for medical advice about side effects. You may report side effects to FDA at 1-800-FDA-1088. Where should I keep my medicine? Keep out of the reach of children. See product for storage instructions. Each product may have different instructions. Keep tightly closed. Throw away any unused medicine after the expiration date. NOTE: This sheet is a summary. It may not cover all possible information. If you have questions about this medicine, talk to your doctor, pharmacist, or health care provider.  2017 Elsevier/Gold Standard (2014-05-19 16:51:41)

## 2016-03-11 LAB — GC/CHLAMYDIA PROBE AMP
CT Probe RNA: NOT DETECTED
GC Probe RNA: NOT DETECTED

## 2016-03-11 LAB — RPR

## 2016-04-05 ENCOUNTER — Emergency Department (HOSPITAL_COMMUNITY)
Admission: EM | Admit: 2016-04-05 | Discharge: 2016-04-05 | Disposition: A | Discharge status: Home / Self Care | Payer: Managed Care, Other (non HMO) | Attending: Emergency Medicine | Admitting: Emergency Medicine

## 2016-04-05 ENCOUNTER — Encounter (HOSPITAL_COMMUNITY): Payer: Self-pay | Admitting: Nurse Practitioner

## 2016-04-05 DIAGNOSIS — Z79899 Other long term (current) drug therapy: Secondary | ICD-10-CM | POA: Insufficient documentation

## 2016-04-05 DIAGNOSIS — R197 Diarrhea, unspecified: Secondary | ICD-10-CM | POA: Insufficient documentation

## 2016-04-05 DIAGNOSIS — R112 Nausea with vomiting, unspecified: Secondary | ICD-10-CM

## 2016-04-05 DIAGNOSIS — J45909 Unspecified asthma, uncomplicated: Secondary | ICD-10-CM | POA: Insufficient documentation

## 2016-04-05 LAB — COMPREHENSIVE METABOLIC PANEL
ALBUMIN: 4.2 g/dL (ref 3.5–5.0)
ALT: 21 U/L (ref 17–63)
ANION GAP: 7 (ref 5–15)
AST: 34 U/L (ref 15–41)
Alkaline Phosphatase: 39 U/L (ref 38–126)
BILIRUBIN TOTAL: 1.3 mg/dL — AB (ref 0.3–1.2)
BUN: 9 mg/dL (ref 6–20)
CHLORIDE: 105 mmol/L (ref 101–111)
CO2: 22 mmol/L (ref 22–32)
Calcium: 8.6 mg/dL — ABNORMAL LOW (ref 8.9–10.3)
Creatinine, Ser: 0.92 mg/dL (ref 0.61–1.24)
GFR calc Af Amer: >60 mL/min (ref >60)
Glucose, Bld: 104 mg/dL — ABNORMAL HIGH (ref 65–99)
POTASSIUM: 3.4 mmol/L — AB (ref 3.5–5.1)
Sodium: 134 mmol/L — ABNORMAL LOW (ref 135–145)
TOTAL PROTEIN: 7.3 g/dL (ref 6.5–8.1)

## 2016-04-05 LAB — URINALYSIS, ROUTINE W REFLEX MICROSCOPIC
BACTERIA UA: NONE SEEN
BILIRUBIN URINE: NEGATIVE
Glucose, UA: NEGATIVE mg/dL
Hgb urine dipstick: NEGATIVE
Ketones, ur: NEGATIVE mg/dL
LEUKOCYTES UA: NEGATIVE
Nitrite: NEGATIVE
PH: 5 (ref 5.0–8.0)
Protein, ur: 30 mg/dL — AB
SPECIFIC GRAVITY, URINE: 1.015 (ref 1.005–1.030)

## 2016-04-05 LAB — CBC WITH DIFFERENTIAL/PLATELET
Basophils Absolute: 0 10*3/uL (ref 0.0–0.1)
Basophils Relative: 0 %
EOS PCT: 0 %
Eosinophils Absolute: 0 10*3/uL (ref 0.0–0.7)
HEMATOCRIT: 38.7 % — AB (ref 39.0–52.0)
Hemoglobin: 14 g/dL (ref 13.0–17.0)
LYMPHS ABS: 0.7 10*3/uL (ref 0.7–4.0)
LYMPHS PCT: 15 %
MCH: 36.1 pg — ABNORMAL HIGH (ref 26.0–34.0)
MCHC: 36.2 g/dL — AB (ref 30.0–36.0)
MCV: 99.7 fL (ref 78.0–100.0)
MONO ABS: 0.2 10*3/uL (ref 0.1–1.0)
MONOS PCT: 5 %
NEUTROS ABS: 3.8 10*3/uL (ref 1.7–7.7)
Neutrophils Relative %: 80 %
PLATELETS: 317 10*3/uL (ref 150–400)
RBC: 3.88 MIL/uL — ABNORMAL LOW (ref 4.22–5.81)
RDW: 17.6 % — AB (ref 11.5–15.5)
WBC: 4.7 10*3/uL (ref 4.0–10.5)

## 2016-04-05 LAB — RETICULOCYTES
RBC.: 3.88 MIL/uL — AB (ref 4.22–5.81)
RETIC CT PCT: 5.2 % — AB (ref 0.4–3.1)
Retic Count, Absolute: 201.8 10*3/uL — ABNORMAL HIGH (ref 19.0–186.0)

## 2016-04-05 LAB — LIPASE, BLOOD: LIPASE: 21 U/L (ref 11–51)

## 2016-04-05 MED ORDER — ONDANSETRON HCL 4 MG/2ML IJ SOLN
4.0000 mg | Freq: Once | INTRAMUSCULAR | Status: AC
Start: 2016-04-05 — End: 2016-04-05
  Administered 2016-04-05: 4 mg via INTRAVENOUS
  Filled 2016-04-05: qty 2

## 2016-04-05 MED ORDER — ACETAMINOPHEN 325 MG PO TABS
650.0000 mg | ORAL_TABLET | Freq: Once | ORAL | Status: AC
  Administered 2016-04-05: 650 mg via ORAL
  Filled 2016-04-05: qty 2

## 2016-04-05 MED ORDER — KCL IN DEXTROSE-NACL 20-5-0.45 MEQ/L-%-% IV SOLN
Freq: Once | INTRAVENOUS | Status: AC
  Administered 2016-04-05: 11:00:00 via INTRAVENOUS
  Filled 2016-04-05: qty 1000

## 2016-04-05 MED ORDER — LOPERAMIDE HCL 2 MG PO CAPS
2.0000 mg | ORAL_CAPSULE | Freq: Once | ORAL | Status: AC
  Administered 2016-04-05: 2 mg via ORAL
  Filled 2016-04-05: qty 1

## 2016-04-05 MED ORDER — LOPERAMIDE HCL 2 MG PO CAPS: 2.0000 mg | ORAL_CAPSULE | Freq: Four times a day (QID) | ORAL | 0 refills | Status: DC | PRN

## 2016-04-05 NOTE — ED Notes (Signed)
3rd attempt for blood work CMP.

## 2016-04-05 NOTE — Discharge Instructions (Signed)
As discussed, your evaluation today has been largely reassuring.  But, it is important that you monitor your condition carefully, and do not hesitate to return to the ED if you develop new, or concerning changes in your condition. ? ?Otherwise, please follow-up with your physician for appropriate ongoing care. ? ?

## 2016-04-05 NOTE — ED Notes (Signed)
Lab called. Says blood hemolyzed. Will put in to re-collect for 3rd time.

## 2016-04-05 NOTE — ED Triage Notes (Signed)
Patient presents to [facility] for nausea, vomiting, and diarrhea onset 2 days ago after eating shrimp alfredo. Does not think food was bad. Has had 5 episodes of emesis and 12 episodes of watery diarrhea. Unable to tolerate food or fluids. Has hx of sickle cell and is concerned dehydration will cause crisis. Has tried pepto bismol at home w/o relief.

## 2016-04-05 NOTE — ED Provider Notes (Signed)
Patient signed out to by Dr. Vanita Panda to check the patient's lipase and comprehensive metabolic panel. Those are reviewed and no significant abnormalities noted. Patient will be discharged   Lacretia Leigh, MD 04/05/16 [phone]

## 2016-04-05 NOTE — ED Notes (Signed)
Lab says that CMP & lipase was hemolyzed. They put in order to recollect but that did not populate in to epic to do so. Nurse was never notified to re-collect. Delay in patient care resulted.

## 2016-04-05 NOTE — ED Provider Notes (Signed)
[facility] DEPT Provider Note   CSN: [id number] Arrival date & time: 04/05/16  0909     History   Chief Complaint Chief Complaint  Patient presents with  . Emesis  . Diarrhea    HPI Jason Franklin is a 21 y.o. male.  HPI  Patient presents with concern nausea, vomiting, diarrhea. Patient has history of sickle cell disease, but does not have typical pain for him. He notes that until 2 days ago he was in his usual state of health. He recalls eating shrimp Alfredo, and subsequently developing this illness. Since onset he has had multiple episodes of both vomiting and diarrhea, and is currently having more bowel movements than emesis. No sustained abdominal pain anywhere, no fever, though the patient feels clammy. No confusion, no disorientation. Patient only pain is mild low back pain, which he states began after multiple episodes of vomiting and diarrhea. No chest pain, no dyspnea. No clearly alleviate or exacerbate factors, no relief with OTC medication.  Past Medical History:  Diagnosis Date  . Asthma   . Sickle cell disease [facility])     Patient Active Problem List   Diagnosis Date Noted  . Thrombocytosis ([place] ) 06/07/2014  . Sickle cell anemia ([place]) 06/02/2014    Past Surgical History:  Procedure Laterality Date  . SPLENECTOMY         Home Medications    Prior to Admission medications   Medication Sig Start Date End Date Taking? Authorizing Provider  bismuth subsalicylate (PEPTO BISMOL) 262 MG/15ML suspension Take 30 mLs by mouth every 4 (four) hours as needed for indigestion.   Yes Historical Provider, MD  hydroxyurea (HYDREA) 500 MG capsule Take 3 capsules (1,500 mg total) by mouth daily. May take with food to minimize GI side effects. 11/27/15  Yes Tresa Garter, MD  albuterol (PROVENTIL HFA;VENTOLIN HFA) 108 (90 BASE) MCG/ACT inhaler Inhale 2 puffs into the lungs every 4 (four) hours as needed for wheezing or shortness of breath. Patient not  taking: Reported on 03/10/2016 05/23/14   Leana Gamer, MD  folic acid (FOLVITE) 1 MG tablet Take 1 tablet (1 mg total) by mouth daily. Patient not taking: Reported on 03/10/2016 11/27/15   Tresa Garter, MD    Family History Family History  Problem Relation Age of Onset  . Sickle cell trait Mother   . Sickle cell trait Father   . Diabetes Mellitus II Paternal Grandmother     Social History Social History  Substance Use Topics  . Smoking status: Never Smoker  . Smokeless tobacco: Former Systems developer     Comment: vapor   . Alcohol use Yes     Comment: occassional     Allergies   Patient has no known allergies.   Review of Systems Review of Systems  Constitutional:       Per HPI, otherwise negative  HENT:       Per HPI, otherwise negative  Respiratory:       Per HPI, otherwise negative  Cardiovascular:       Per HPI, otherwise negative  Gastrointestinal: Positive for diarrhea, nausea and vomiting.  Endocrine:       Negative aside from HPI  Genitourinary:       Neg aside from HPI   Musculoskeletal:       Per HPI, otherwise negative  Skin: Negative.   Neurological: Negative for syncope.     Physical Exam Updated Vital Signs BP 122/83 (BP Location: Left Arm)   Pulse (!) 11  Temp 99.9 F (37.7 C) (Oral)   Resp 18   SpO2 100%   Physical Exam  Constitutional: He is oriented to person, place, and time. He appears well-developed. No distress.  HENT:  Head: Normocephalic and atraumatic.  Eyes: Conjunctivae and EOM are normal.  Cardiovascular: Normal rate and regular rhythm.   Pulmonary/Chest: Effort normal. No stridor. No respiratory distress.  Abdominal: He exhibits no distension. There is no tenderness.  Musculoskeletal: He exhibits no edema.  Neurological: He is alert and oriented to person, place, and time.  Skin: Skin is warm and dry.  Psychiatric: He has a normal mood and affect.  Nursing note and vitals reviewed.    ED Treatments / Results    Labs (all labs ordered are listed, but only abnormal results are displayed) Labs Reviewed  CBC WITH DIFFERENTIAL/PLATELET - Abnormal; Notable for the following:       Result Value   RBC 3.88 (*)    HCT 38.7 (*)    MCH 36.1 (*)    MCHC 36.2 (*)    RDW 17.6 (*)    All other components within normal limits  URINALYSIS, ROUTINE W REFLEX MICROSCOPIC - Abnormal; Notable for the following:    Protein, ur 30 (*)    Squamous Epithelial / LPF 0-5 (*)    All other components within normal limits  RETICULOCYTES - Abnormal; Notable for the following:    Retic Ct Pct 5.2 (*)    RBC. 3.88 (*)    Retic Count, Manual 201.8 (*)    All other components within normal limits  COMPREHENSIVE METABOLIC PANEL  LIPASE, BLOOD   Procedures Procedures (including critical care time)  Medications Ordered in ED Medications  ondansetron (ZOFRAN) injection 4 mg (not administered)  dextrose 5 % and 0.45 % NaCl with KCl 20 mEq/L infusion (not administered)     Initial Impression / Assessment and Plan / ED Course  I have reviewed the triage vital signs and the nursing notes.  Pertinent labs & imaging results that were available during my care of the patient were reviewed by me and considered in my medical decision making (see chart for details).  4:07 PM Patient appears calm, no additional episodes of vomiting, patient has had some diarrhea. Physical exam reassuring, initial labs reassuring. There is been accomplished with lab, and the patient is currently receiving his third attempt at chemistry panel. If this result is similarly reassuring, patient will be discharged with outpatient follow-up. Absent evidence for peritonitis, bacteremia, sepsis, symptoms may be  secondary to foodborne reaction  Final Clinical Impressions(s) / ED Diagnoses  Nausea, vomiting and diarrhea   [name], MD 04/05/16 1610

## 2016-09-08 ENCOUNTER — Ambulatory Visit (INDEPENDENT_AMBULATORY_CARE_PROVIDER_SITE_OTHER): Payer: Managed Care, Other (non HMO) | Admitting: Family Medicine

## 2016-09-08 ENCOUNTER — Encounter: Payer: Self-pay | Admitting: Family Medicine

## 2016-09-08 DIAGNOSIS — D571 Sickle-cell disease without crisis: Secondary | ICD-10-CM | POA: Diagnosis not present

## 2016-09-08 LAB — COMPLETE METABOLIC PANEL WITH GFR
ALK PHOS: 46 U/L (ref 40–115)
ALT: 56 U/L — AB (ref 9–46)
AST: 40 U/L (ref 10–40)
Albumin: 4.6 g/dL (ref 3.6–5.1)
BILIRUBIN TOTAL: 1.2 mg/dL (ref 0.2–1.2)
BUN: 10 mg/dL (ref 7–25)
CO2: 25 mmol/L (ref 20–31)
CREATININE: 0.8 mg/dL (ref 0.60–1.35)
Calcium: 9.2 mg/dL (ref 8.6–10.3)
Chloride: 105 mmol/L (ref 98–110)
Glucose, Bld: 73 mg/dL (ref 65–99)
Potassium: 4.1 mmol/L (ref 3.5–5.3)
Sodium: 139 mmol/L (ref 135–146)
TOTAL PROTEIN: 7.1 g/dL (ref 6.1–8.1)

## 2016-09-08 LAB — CBC WITH DIFFERENTIAL/PLATELET
BASOS ABS: 0 {cells}/uL (ref 0–200)
Basophils Relative: 0 %
EOS ABS: 112 {cells}/uL (ref 15–500)
EOS PCT: 2 %
HCT: 33.9 % — ABNORMAL LOW (ref 38.5–50.0)
Hemoglobin: 11.7 g/dL — ABNORMAL LOW (ref 13.2–17.1)
LYMPHS PCT: 39 %
Lymphs Abs: 2184 cells/uL (ref 850–3900)
MCH: 36.6 pg — ABNORMAL HIGH (ref 27.0–33.0)
MCHC: 34.5 g/dL (ref 32.0–36.0)
MCV: 105.9 fL — AB (ref 80.0–100.0)
MONOS PCT: 8 %
MPV: 10.4 fL (ref 7.5–12.5)
Monocytes Absolute: 448 cells/uL (ref 200–950)
Neutro Abs: 2856 cells/uL (ref 1500–7800)
Neutrophils Relative %: 51 %
PLATELETS: 241 10*3/uL (ref 140–400)
RBC: 3.2 MIL/uL — ABNORMAL LOW (ref 4.20–5.80)
RDW: 18.4 % — AB (ref 11.0–15.0)
WBC: 5.6 10*3/uL (ref 3.8–10.8)

## 2016-09-08 LAB — POCT URINALYSIS DIP (DEVICE)
Bilirubin Urine: NEGATIVE
Glucose, UA: NEGATIVE mg/dL
HGB URINE DIPSTICK: NEGATIVE
KETONES UR: NEGATIVE mg/dL
Leukocytes, UA: NEGATIVE
Nitrite: NEGATIVE
PH: 6.5 (ref 5.0–8.0)
PROTEIN: NEGATIVE mg/dL
SPECIFIC GRAVITY, URINE: 1.015 (ref 1.005–1.030)
Urobilinogen, UA: 2 mg/dL — ABNORMAL HIGH (ref 0.0–1.0)

## 2016-09-08 LAB — RETICULOCYTES
ABS Retic: 140800 cells/uL — ABNORMAL HIGH (ref 25000–90000)
RBC.: 3.2 MIL/uL — ABNORMAL LOW (ref 4.20–5.80)
Retic Ct Pct: 4.4 %

## 2016-09-08 MED ORDER — FOLIC ACID 1 MG PO TABS: 1.0000 mg | ORAL_TABLET | Freq: Every day | ORAL | 3 refills | Status: DC

## 2016-09-08 NOTE — Patient Instructions (Signed)
Sickle Cell Anemia, Adult °Sickle cell anemia is a condition where your red blood cells are shaped like sickles. Red blood cells carry oxygen through the body. Sickle-shaped red blood cells do not live as long as normal red blood cells. They also clump together and block blood from flowing through the blood vessels. These things prevent the body from getting enough oxygen. Sickle cell anemia causes organ damage and pain. It also increases the risk of infection. °Follow these instructions at home: °· Drink enough fluid to keep your pee (urine) clear or pale yellow. Drink more in hot weather and during exercise. °· Do not smoke. Smoking lowers oxygen levels in the blood. °· Only take over-the-counter or prescription medicines as told by your doctor. °· Take antibiotic medicines as told by your doctor. Make sure you finish them even if you start to feel better. °· Take supplements as told by your doctor. °· Consider wearing a medical alert bracelet. This tells anyone caring for you in an emergency of your condition. °· When traveling, keep your medical information, doctors' names, and the medicines you take with you at all times. °· If you have a fever, do not take fever medicines right away. This could cover up a problem. Tell your doctor. °· Keep all follow-up visits with your doctor. Sickle cell anemia requires regular medical care. °Contact a doctor if: °You have a fever. °Get help right away if: °· You feel dizzy or faint. °· You have new belly (abdominal) pain, especially on the left side near the stomach area. °· You have a lasting, often uncomfortable and painful erection of the penis (priapism). If it is not treated right away, you will become unable to have sex (impotence). °· You have numbness in your arms or legs or you have a hard time moving them. °· You have a hard time talking. °· You have a fever or lasting symptoms for more than 2-3 days. °· You have a fever and your symptoms suddenly get  worse. °· You have signs or symptoms of infection. These include: °? Chills. °? Being more tired than normal (lethargy). °? Irritability. °? Poor eating. °? Throwing up (vomiting). °· You have pain that is not helped with medicine. °· You have shortness of breath. °· You have pain in your chest. °· You are coughing up pus-like or bloody mucus. °· You have a stiff neck. °· Your feet or hands swell or have pain. °· Your belly looks bloated. °· Your joints hurt. °This information is not intended to replace advice given to you by your health care provider. Make sure you discuss any questions you have with your health care provider. °Document Released: 11/24/2012 Document Revised: 07/12/2015 Document Reviewed: 09/15/2012 °Elsevier Interactive Patient Education © 2017 Elsevier Inc. ° °

## 2016-09-08 NOTE — Progress Notes (Signed)
Subjective:    Patient ID: Jason Franklin, male    DOB: [birth year], 21 y.o.   MRN: [id number]  HPI 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  He says that he feels well and is without complaint. He typically does not take opiate medications for pain management. He says that he typically takes Ibuprofen for mild to moderate pain. He says that it has been several years since he has had a sickle cell crisis.  Patient is taking hydroxyurea 1500 mg consistently.  He denies headache, shortness of breath, chest pain, dysuria, nausea, vomiting, or diarrhea.   Past Medical History:  Diagnosis Date  . Asthma   . Sickle cell disease ([place])    Immunization History  Administered Date(s) Administered  . DTP 06/12/1993  . DTaP 06/12/1993, 08/14/1993, 10/09/1993, 08/13/1994  . Hepatitis B 06/18/1993, 10/09/1993  . HiB (PRP-OMP) 06/12/1993, 08/14/1993, 10/09/1993  . Influenza,inj,Quad PF,36+ Mos 03/04/2014, 11/27/2015  . MMR 10/15/1994  . Pneumococcal Polysaccharide-23 07/30/1994  . Td 08/12/2004  . Tdap 04/08/2004, 03/10/2016    Social History   Social History  . Marital status: Single    Spouse name: N/A  . Number of children: N/A  . Years of education: N/A   Occupational History  . Not on file.   Social History Main Topics  . Smoking status: Never Smoker  . Smokeless tobacco: Former Systems developer     Comment: vapor   . Alcohol use Yes     Comment: occassional  . Drug use: Yes    Types: Marijuana  . Sexual activity: Yes    Birth control/ protection: Condom   Other Topics Concern  . Not on file   Social History Narrative  . No narrative on file  No Known Allergies Review of Systems  Constitutional: Negative.   HENT: Negative.   Eyes: Negative.   Cardiovascular: Negative.  Negative for chest pain, palpitations and leg swelling.  Endocrine: Negative.  Negative for polydipsia, polyphagia and polyuria.   Genitourinary: Negative.   Musculoskeletal: Negative.   Skin: Negative.   Allergic/Immunologic: Negative.   Neurological: Negative.   Psychiatric/Behavioral: Negative.       Objective:   Physical Exam  Constitutional: He is oriented to person, place, and time. He appears well-developed.  HENT:  Head: Normocephalic and atraumatic.  Right Ear: External ear normal.  Left Ear: External ear normal.  Nose: Nose normal.  Mouth/Throat: Oropharynx is clear and moist.  Eyes: Pupils are equal, round, and reactive to light. Conjunctivae and EOM are normal.  Neck: Normal range of motion. Neck supple.  Cardiovascular: Normal rate, normal heart sounds and intact distal pulses.   Pulmonary/Chest: Effort normal and breath sounds normal.  Abdominal: Soft. Bowel sounds are normal.  Neurological: He is alert and oriented to person, place, and time. He has normal reflexes.  Skin: Skin is warm and dry.  Psychiatric: He has a normal mood and affect. His behavior is normal. Judgment and thought content normal.     BP 118/68 (BP Location: Left Arm, Patient Position: Sitting, Cuff Size: Normal)   Pulse 75   Temp 98.5 F (36.9 C) (Oral)   Resp 14   Ht '5\' 9"'  (1.753 m)   Wt 150 lb (68 kg)   SpO2 100%   BMI 22.15 kg/m  Assessment & Plan:  1. Hb-SS disease without crisis ([place])  Sickle cell disease - Continue Hydrea 1500 mg. Will  check Reticulocytes, platelet count, and ANC.  We discussed the need for good hydration, monitoring of hydration status, avoidance of heat, cold, stress, and infection triggers. We discussed the risks and benefits of Hydrea, including bone marrow suppression, the possibility of GI upset, skin ulcers, hair thinning, and teratogenicity. The patient was reminded of the need to seek medical attention of any symptoms of bleeding, anemia, or infection.   He has not been taking folic acid consistently. Advised to continue folic acid 1 mg daily to prevent aplastic bone marrow crises.    Reviewed urinalysis, no proteinuria present   Pulmonary evaluation - Patient denies severe recurrent wheezes, shortness of breath with exercise, or persistent cough. If these symptoms develop, pulmonary function tests with spirometry will be ordered, and if abnormal, plan on referral to Pulmonology for further evaluation.   Cardiac - Routine screening for pulmonary hypertension is not recommended.  Eye - High risk of proliferative retinopathy. Annual eye exam with retinal exam recommended to patient. Sent referral, patient has not scheduled appointment.  Immunization status - Up to date with immunizations     - folic acid (FOLVITE) 1 MG tablet; Take 1 tablet (1 mg total) by mouth daily.  Dispense: 90 tablet; Refill: 3 - COMPLETE METABOLIC PANEL WITH GFR - CBC with Differential - Reticulocytes   RTC: 6 months for sickle cell anemia  Donia Pounds  MSN, FNP-C [facility] [address] [place], [place] [address] [phone]

## 2016-09-09 ENCOUNTER — Telehealth: Payer: Self-pay

## 2016-09-09 NOTE — Telephone Encounter (Signed)
Called and left a message for patient that all labs were normal and no medication changes were going to be made at this time. Asked if any questions to call back to our office. Thanks!

## 2016-09-09 NOTE — Telephone Encounter (Signed)
-----   Message from Dorena Dew, [place] sent at 09/09/2016 10:10 AM EDT ----- Regarding: lab results Please inform patient that all laboratory values are within normal limits, no medication changes warranted at this time.    Thanks

## 2016-10-10 ENCOUNTER — Other Ambulatory Visit: Payer: Self-pay | Admitting: Internal Medicine

## 2016-10-10 DIAGNOSIS — D571 Sickle-cell disease without crisis: Secondary | ICD-10-CM

## 2016-10-13 ENCOUNTER — Other Ambulatory Visit: Payer: Self-pay

## 2016-10-13 DIAGNOSIS — D571 Sickle-cell disease without crisis: Secondary | ICD-10-CM

## 2016-10-13 MED ORDER — HYDROXYUREA 500 MG PO CAPS: ORAL_CAPSULE | ORAL | 5 refills | Status: DC

## 2016-10-13 NOTE — Telephone Encounter (Signed)
Refill for 6 month until patients next appointment in January. Thanks!

## 2017-03-03 IMAGING — CT CT ANGIO CHEST
1 of 2 series · 19 of 32 positions shown · IV contrast (OMNIPAQUE 300)
Comparison: 05/20/2014, 05/03/2012

CLINICAL DATA: Elevated D-dimer, acute chest pain, sickle cell
anemia

EXAM:
CT ANGIOGRAPHY CHEST WITH CONTRAST
TECHNIQUE: Multidetector CT imaging of the chest was performed using the
standard protocol during bolus administration of intravenous
contrast. Multiplanar CT image reconstructions and MIPs were
obtained to evaluate the vascular anatomy.
CONTRAST:  100mL OMNIPAQUE IOHEXOL 350 MG/ML SOLN

[Series 6: thins for pacs · axial · 0.68mm/px · z∈[-319,-53]mm · 19 of 292 slices shown]
[im 13/292  lung]
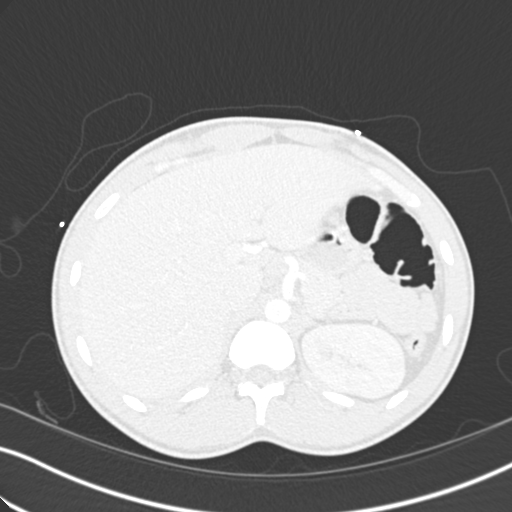
[im 26/292  soft-tissue]
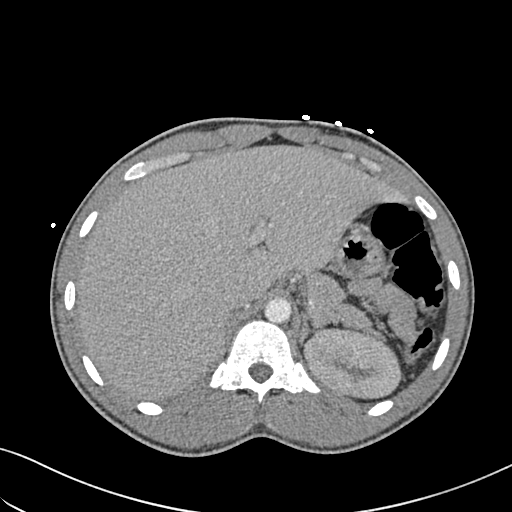
[im 38/292  lung]
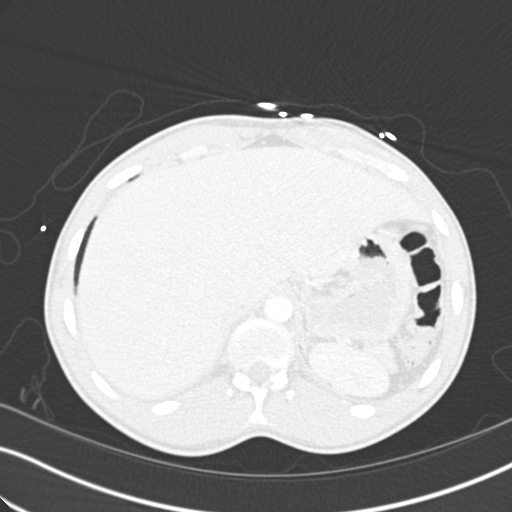
[im 64/292  soft-tissue]
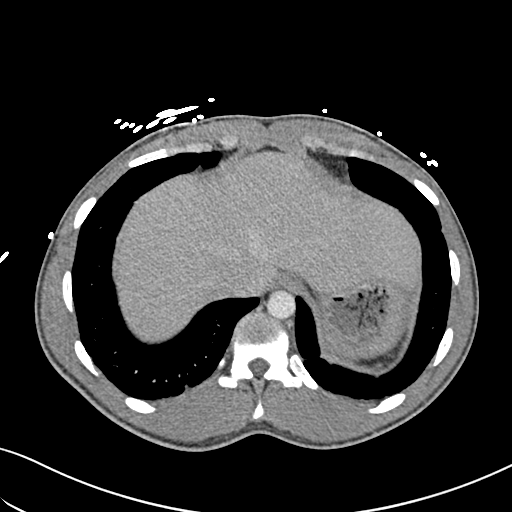
[im 76/292  lung]
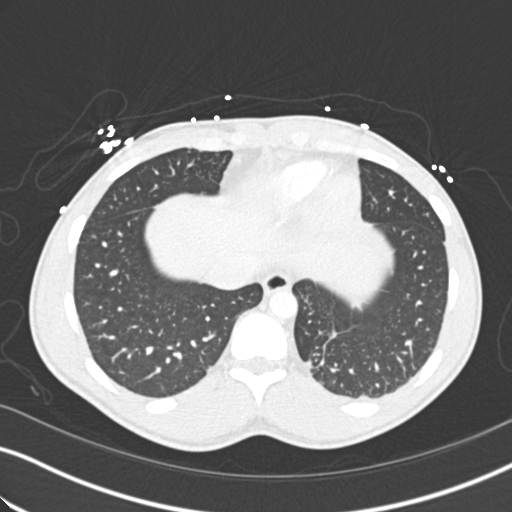
[im 89/292  soft-tissue]
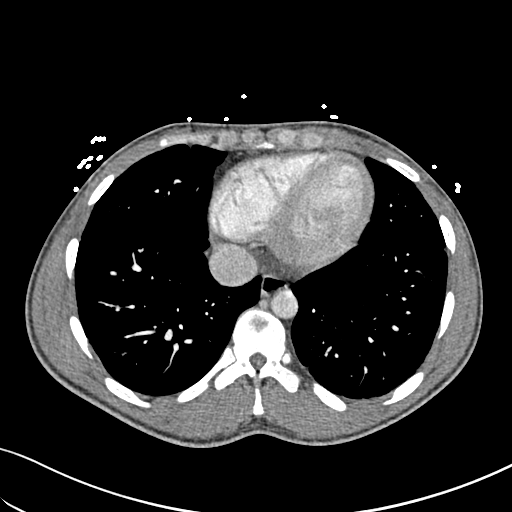
[im 102/292  lung]
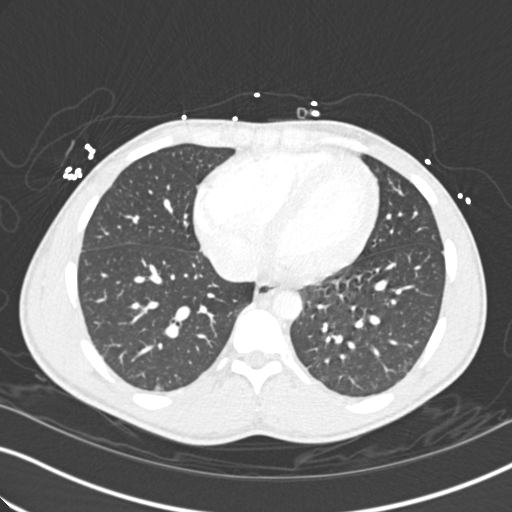
[im 114/292  soft-tissue]
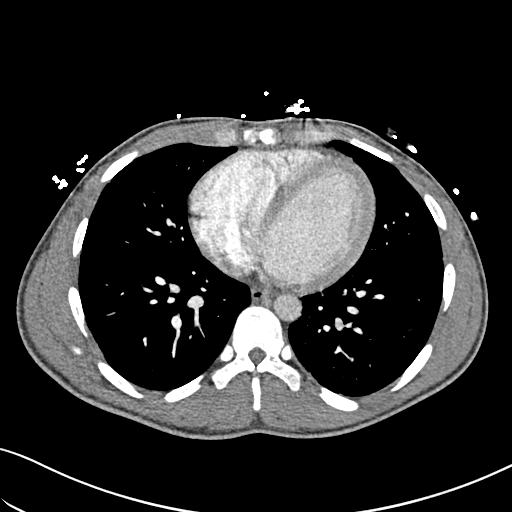
[im 127/292  lung]
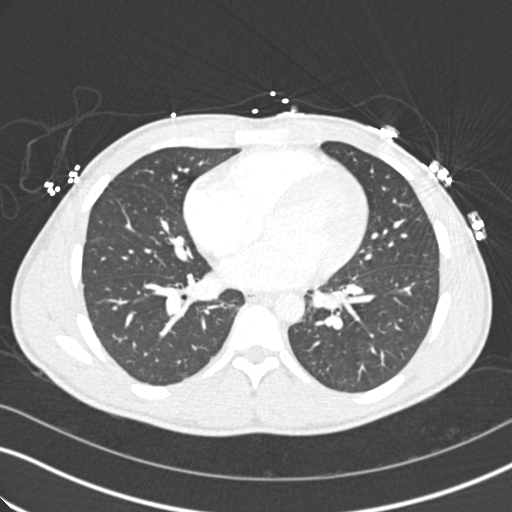
[im 152/292  soft-tissue]
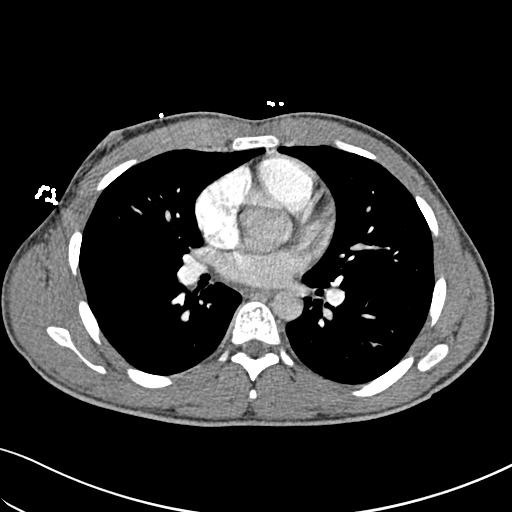
[im 165/292  lung]
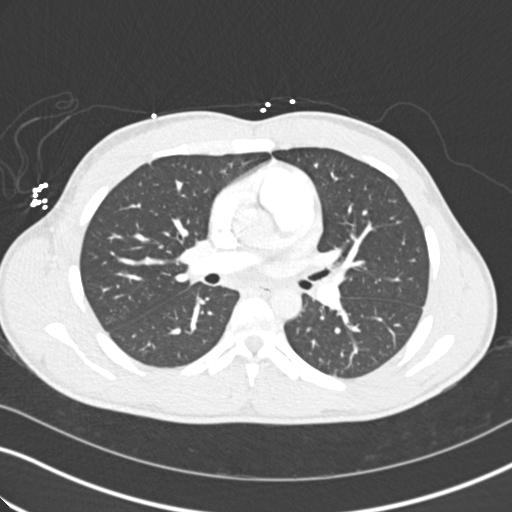
[im 178/292  soft-tissue]
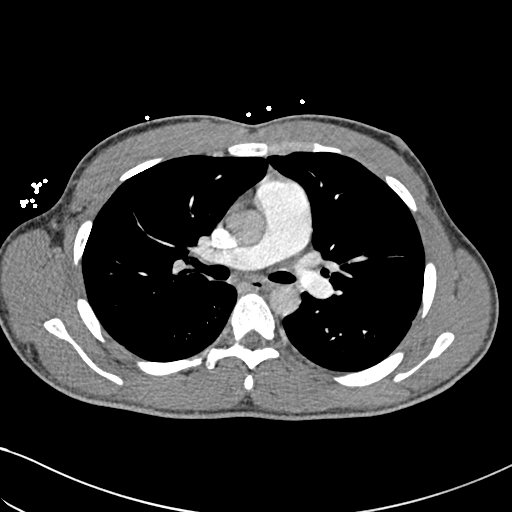
[im 190/292  lung]
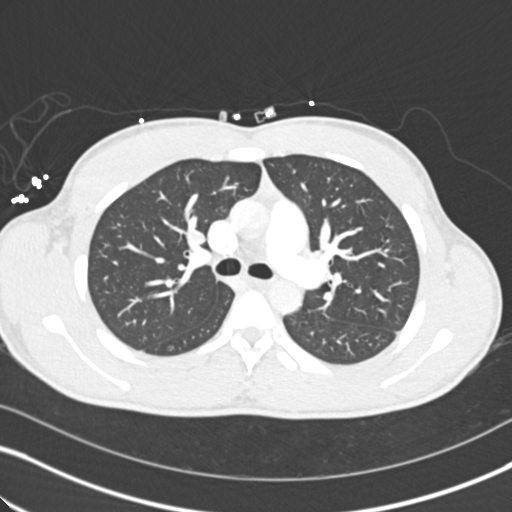
[im 203/292  soft-tissue]
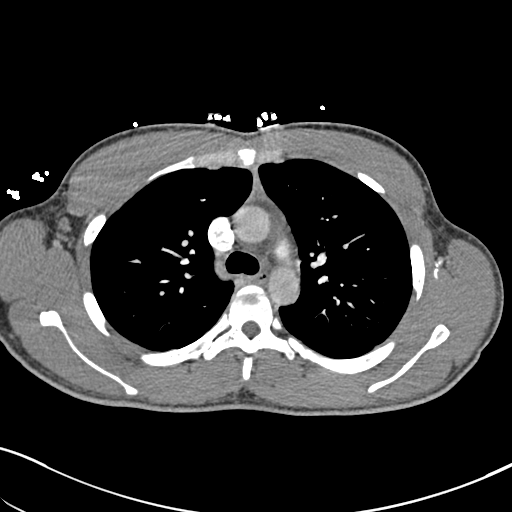
[im 216/292  lung]
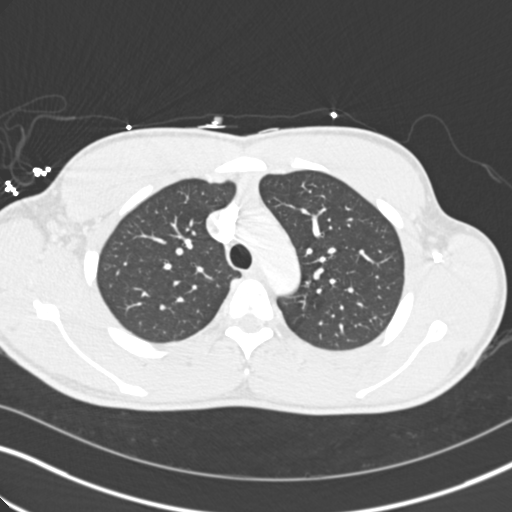
[im 228/292  soft-tissue]
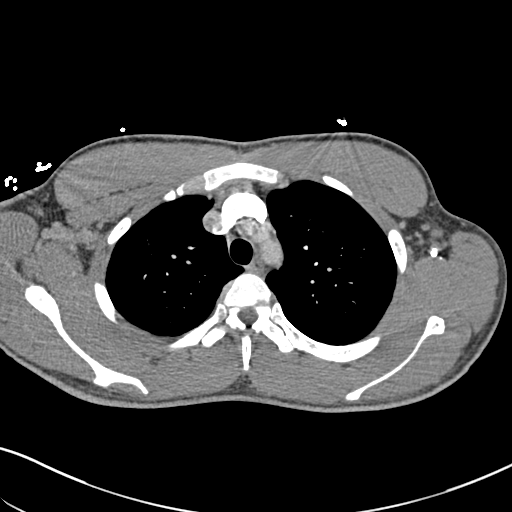
[im 254/292  lung]
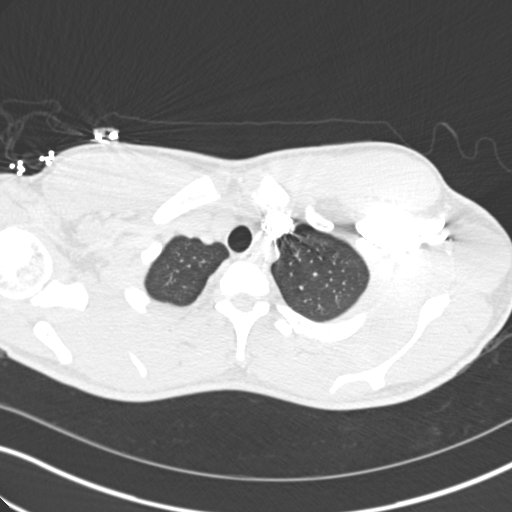
[im 266/292  soft-tissue]
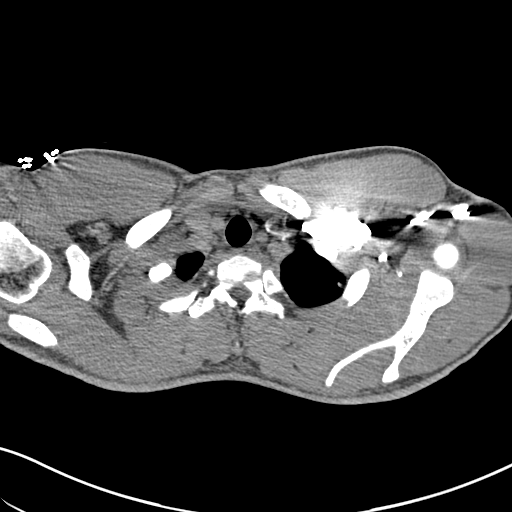
[im 279/292  lung]
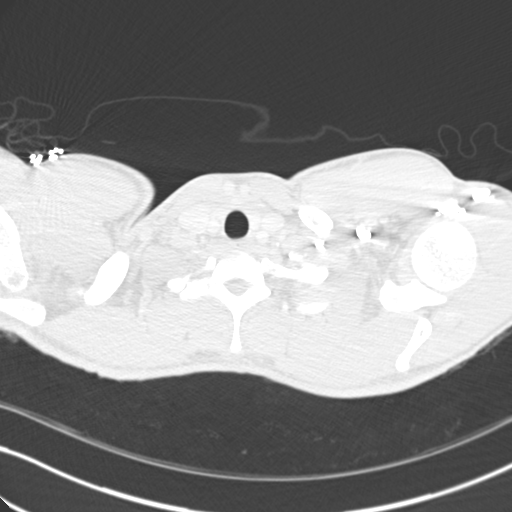

[19 of 32 positions shown; findings below may reference images not displayed]

FINDINGS: Pulmonary arteries are well visualized. No significant filling
defect or pulmonary embolus demonstrated by CTA. Normal heart size.
No pericardial or pleural effusion. No adenopathy. Small nonenlarged
axillary lymph nodes noted. No hiatal hernia.

Included upper abdomen demonstrates prior splenectomy. No acute
upper abdominal finding.

Lung windows demonstrate a stable left apical 5 mm nodule, image 10.
Minor scattered bibasilar dependent atelectasis. No focal pneumonia,
collapse or consolidation. No interstitial process or edema.
Negative for pneumothorax. Trachea and central airways remain
patent.

No acute osseous finding.

Review of the MIP images confirms the above findings.
IMPRESSION: No significant acute pulmonary embolus by CTA.

Stable 5 mm left upper lobe subpleural pulmonary nodule since
05/03/2012 compatible with a benign nodule.

Minimal bibasilar atelectasis.

No acute intra thoracic finding.

## 2017-03-11 ENCOUNTER — Encounter: Payer: Self-pay | Admitting: Family Medicine

## 2017-03-11 ENCOUNTER — Ambulatory Visit (INDEPENDENT_AMBULATORY_CARE_PROVIDER_SITE_OTHER): Payer: Managed Care, Other (non HMO) | Admitting: Family Medicine

## 2017-03-11 VITALS — BP 119/67 | HR 94 | Temp 98.4°F | Resp 14 | Ht 69.0 in | Wt 155.0 lb

## 2017-03-11 DIAGNOSIS — D571 Sickle-cell disease without crisis: Secondary | ICD-10-CM | POA: Diagnosis not present

## 2017-03-11 DIAGNOSIS — Z23 Encounter for immunization: Secondary | ICD-10-CM

## 2017-03-11 MED ORDER — FOLIC ACID 1 MG PO TABS: 1.0000 mg | ORAL_TABLET | Freq: Every day | ORAL | 3 refills | Status: DC

## 2017-03-11 NOTE — Patient Instructions (Addendum)
Please contact Dr. Herbert Deaner to schedule an ophthalmology visit [phone]    Sickle Cell Anemia, Adult Sickle cell anemia is a condition where your red blood cells are shaped like sickles. Red blood cells carry oxygen through the body. Sickle-shaped red blood cells do not live as long as normal red blood cells. They also clump together and block blood from flowing through the blood vessels. These things prevent the body from getting enough oxygen. Sickle cell anemia causes organ damage and pain. It also increases the risk of infection. Follow these instructions at home:  Drink enough fluid to keep your pee (urine) clear or pale yellow. Drink more in hot weather and during exercise.  Do not smoke. Smoking lowers oxygen levels in the blood.  Only take over-the-counter or prescription medicines as told by your doctor.  Take antibiotic medicines as told by your doctor. Make sure you finish them even if you start to feel better.  Take supplements as told by your doctor.  Consider wearing a medical alert bracelet. This tells anyone caring for you in an emergency of your condition.  When traveling, keep your medical information, doctors' names, and the medicines you take with you at all times.  If you have a fever, do not take fever medicines right away. This could cover up a problem. Tell your doctor.  Keep all follow-up visits with your doctor. Sickle cell anemia requires regular medical care. Contact a doctor if: You have a fever. Get help right away if:  You feel dizzy or faint.  You have new belly (abdominal) pain, especially on the left side near the stomach area.  You have a lasting, often uncomfortable and painful erection of the penis (priapism). If it is not treated right away, you will become unable to have sex (impotence).  You have numbness in your arms or legs or you have a hard time moving them.  You have a hard time talking.  You have a fever or lasting symptoms for  more than 2-3 days.  You have a fever and your symptoms suddenly get worse.  You have signs or symptoms of infection. These include: ? Chills. ? Being more tired than normal (lethargy). ? Irritability. ? Poor eating. ? Throwing up (vomiting).  You have pain that is not helped with medicine.  You have shortness of breath.  You have pain in your chest.  You are coughing up pus-like or bloody mucus.  You have a stiff neck.  Your feet or hands swell or have pain.  Your belly looks bloated.  Your joints hurt. This information is not intended to replace advice given to you by your health care provider. Make sure you discuss any questions you have with your health care provider. Document Released: 11/24/2012 Document Revised: 07/12/2015 Document Reviewed: 09/15/2012 Elsevier Interactive Patient Education  2017 [employer].

## 2017-03-11 NOTE — Progress Notes (Signed)
Subjective:    Patient ID: Jason Franklin, male    DOB: 30-May-1995, 22 y.o.   MRN: [id number]  HPI [name], a 22 year old male with a history of sickle cell anemia, HbSS presents for a 6 month follow up.  He says that he feels well and is without complaint. He typically does not take opiate medications for pain management. He says that he typically takes Ibuprofen for mild to moderate pain. He says that it has been several years since he has had a sickle cell crisis.  Patient is taking hydroxyurea 1500 mg consistently and folic acid.  He denies headache, shortness of breath, chest pain, dysuria, nausea, vomiting, or diarrhea.   Past Medical History:  Diagnosis Date  . Asthma   . Sickle cell disease ([place])    Immunization History  Administered Date(s) Administered  . DTP 06/12/1993  . DTaP 06/12/1993, 08/14/1993, 10/09/1993, 08/13/1994  . Hepatitis B 06/18/1993, 10/09/1993  . HiB (PRP-OMP) 06/12/1993, 08/14/1993, 10/09/1993  . Influenza,inj,Quad PF,6+ Mos 03/04/2014, 11/27/2015  . MMR 10/15/1994  . Pneumococcal Polysaccharide-23 07/30/1994  . Td 08/12/2004  . Tdap 04/08/2004, 03/10/2016    Social History   Socioeconomic History  . Marital status: Single    Spouse name: Not on file  . Number of children: Not on file  . Years of education: Not on file  . Highest education level: Not on file  Social Needs  . Financial resource strain: Not on file  . Food insecurity - worry: Not on file  . Food insecurity - inability: Not on file  . Transportation needs - medical: Not on file  . Transportation needs - non-medical: Not on file  Occupational History  . Not on file  Tobacco Use  . Smoking status: Never Smoker  . Smokeless tobacco: Former Systems developer  . Tobacco comment: vapor   Substance and Sexual Activity  . Alcohol use: Yes    Comment: occassional  . Drug use: Yes    Types: Marijuana  . Sexual activity: Yes    Birth control/protection: Condom  Other Topics Concern   . Not on file  Social History Narrative  . Not on file  No Known Allergies Review of Systems  Constitutional: Negative.   HENT: Negative.   Eyes: Negative.   Cardiovascular: Negative.  Negative for chest pain, palpitations and leg swelling.  Endocrine: Negative.  Negative for polydipsia, polyphagia and polyuria.  Genitourinary: Negative.   Musculoskeletal: Negative.   Skin: Negative.   Allergic/Immunologic: Negative.   Neurological: Negative.   Psychiatric/Behavioral: Negative.       Objective:   Physical Exam  Constitutional: He is oriented to person, place, and time. He appears well-developed.  HENT:  Head: Normocephalic and atraumatic.  Right Ear: External ear normal.  Left Ear: External ear normal.  Nose: Nose normal.  Mouth/Throat: Oropharynx is clear and moist.  Eyes: Conjunctivae and EOM are normal. Pupils are equal, round, and reactive to light.  Neck: Normal range of motion. Neck supple.  Cardiovascular: Normal rate, normal heart sounds and intact distal pulses.  Pulmonary/Chest: Effort normal and breath sounds normal.  Abdominal: Soft. Bowel sounds are normal.  Neurological: He is alert and oriented to person, place, and time. He has normal reflexes.  Skin: Skin is warm and dry.  Psychiatric: He has a normal mood and affect. His behavior is normal. Judgment and thought content normal.     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 Will check Reticulocytes, platelet count, and ANC.  We discussed the need for good hydration, monitoring of hydration status, avoidance of heat, cold, stress, and infection triggers. We discussed the risks and benefits of Hydrea, including bone marrow suppression, the  possibility of GI upset, skin ulcers, hair thinning, and teratogenicity. The patient was reminded of the need to seek medical attention of any symptoms of bleeding, anemia, or infection.   He has not been taking folic acid consistently. Advised to continue folic acid 1 mg daily to prevent aplastic bone marrow crises.   Reviewed urinalysis, no proteinuria present   Pulmonary evaluation - Patient denies severe recurrent wheezes, shortness of breath with exercise, or persistent cough. If these symptoms develop, pulmonary function tests with spirometry will be ordered, and if abnormal, plan on referral to Pulmonology for further evaluation.   Cardiac - Routine screening for pulmonary hypertension is not recommended.  Eye - High risk of proliferative retinopathy. Annual eye exam with retinal exam recommended to patient. Sent referral, patient has not scheduled appointment.  - CMP and Liver - CBC with Differential - Reticulocytes - folic acid (FOLVITE) 1 MG tablet; Take 1 tablet (1 mg total) by mouth daily.  Dispense: 90 tablet; Refill: 3     Influenza vaccination given  - Flu Vaccine QUAD 36+ mos IM (Fluarix & Fluzone Quad PF RTC: 6 months for sickle cell anemia     Donia Pounds  MSN, FNP-C [place] [address] [place], [place] [address] [phone]

## 2017-03-12 ENCOUNTER — Telehealth: Payer: Self-pay

## 2017-03-12 LAB — CMP AND LIVER
ALBUMIN: 4.7 g/dL (ref 3.5–5.5)
ALT: 30 IU/L (ref 0–44)
AST: 26 IU/L (ref 0–40)
Alkaline Phosphatase: 53 IU/L (ref 39–117)
BUN: 8 mg/dL (ref 6–20)
Bilirubin Total: 0.8 mg/dL (ref 0.0–1.2)
Bilirubin, Direct: 0.19 mg/dL (ref 0.00–0.40)
CALCIUM: 9.7 mg/dL (ref 8.7–10.2)
CO2: 22 mmol/L (ref 20–29)
Chloride: 107 mmol/L — ABNORMAL HIGH (ref 96–106)
Creatinine, Ser: 0.83 mg/dL (ref 0.76–1.27)
GFR, EST AFRICAN AMERICAN: 143 mL/min/{1.73_m2} (ref >59)
GFR, EST NON AFRICAN AMERICAN: 124 mL/min/{1.73_m2} (ref >59)
Glucose: 96 mg/dL (ref 65–99)
POTASSIUM: 4.3 mmol/L (ref 3.5–5.2)
SODIUM: 146 mmol/L — AB (ref 134–144)
TOTAL PROTEIN: 7.4 g/dL (ref 6.0–8.5)

## 2017-03-12 LAB — CBC WITH DIFFERENTIAL/PLATELET
Basophils Absolute: 0 10*3/uL (ref 0.0–0.2)
Basos: 1 %
EOS (ABSOLUTE): 0.1 10*3/uL (ref 0.0–0.4)
Eos: 3 %
HEMOGLOBIN: 12.2 g/dL — AB (ref 13.0–17.7)
Hematocrit: 35 % — ABNORMAL LOW (ref 37.5–51.0)
Immature Grans (Abs): 0 10*3/uL (ref 0.0–0.1)
Immature Granulocytes: 0 %
LYMPHS ABS: 1.7 10*3/uL (ref 0.7–3.1)
Lymphs: 49 %
MCH: 38.4 pg — AB (ref 26.6–33.0)
MCHC: 34.9 g/dL (ref 31.5–35.7)
MCV: 110 fL — ABNORMAL HIGH (ref 79–97)
MONOCYTES: 8 %
Monocytes Absolute: 0.3 10*3/uL (ref 0.1–0.9)
NEUTROS ABS: 1.4 10*3/uL (ref 1.4–7.0)
NRBC: 8 % — AB (ref 0–0)
Neutrophils: 39 %
Platelets: 296 10*3/uL (ref 150–379)
RBC: 3.18 x10E6/uL — AB (ref 4.14–5.80)
RDW: 18.5 % — ABNORMAL HIGH (ref 12.3–15.4)
WBC: 3.5 10*3/uL (ref 3.4–10.8)

## 2017-03-12 LAB — RETICULOCYTES: RETIC CT PCT: 3.6 % — AB (ref 0.6–2.6)

## 2017-03-12 NOTE — Telephone Encounter (Signed)
-----   Message from Dorena Dew, [place] sent at 03/12/2017  8:07 AM EST ----- Regarding: lab results Please inform patient that laboratory results are consistent with baseline.  We will continue medications at current dosage.  Please continue daily hydration with 64 ounces of water.  We will follow-up in 6 months as scheduled Thanks

## 2017-03-12 NOTE — Telephone Encounter (Signed)
Called, no answer. Left a message informing patient that labs were consistent with baseline, to continue medications at current dosages, drink 64 ounces of water daily and follow up in 6 months as scheduled. Asked if any questions to call back to our office and call back number was provided. Thanks!

## 2017-07-06 ENCOUNTER — Other Ambulatory Visit: Payer: Self-pay | Admitting: Family Medicine

## 2017-07-06 DIAGNOSIS — D571 Sickle-cell disease without crisis: Secondary | ICD-10-CM

## 2017-08-05 ENCOUNTER — Telehealth: Payer: Self-pay | Admitting: Family Medicine

## 2017-08-05 ENCOUNTER — Other Ambulatory Visit: Payer: Self-pay | Admitting: Family Medicine

## 2017-08-05 DIAGNOSIS — D571 Sickle-cell disease without crisis: Secondary | ICD-10-CM

## 2017-08-05 NOTE — Telephone Encounter (Signed)
Pt called and states that he needs a refill of hydroxyurea medication; he received one refill after May appointment and needs an authorization for additional refills

## 2017-09-06 ENCOUNTER — Other Ambulatory Visit: Payer: Self-pay | Admitting: Family Medicine

## 2017-09-06 DIAGNOSIS — D571 Sickle-cell disease without crisis: Secondary | ICD-10-CM

## 2017-09-09 ENCOUNTER — Ambulatory Visit (INDEPENDENT_AMBULATORY_CARE_PROVIDER_SITE_OTHER): Payer: Managed Care, Other (non HMO) | Admitting: Family Medicine

## 2017-09-09 ENCOUNTER — Encounter: Payer: Self-pay | Admitting: Family Medicine

## 2017-09-09 VITALS — BP 122/73 | HR 85 | Temp 98.6°F | Resp 16 | Ht 69.0 in | Wt 163.0 lb

## 2017-09-09 DIAGNOSIS — D571 Sickle-cell disease without crisis: Secondary | ICD-10-CM | POA: Diagnosis not present

## 2017-09-09 MED ORDER — FOLIC ACID 1 MG PO TABS: 1.0000 mg | ORAL_TABLET | Freq: Every day | ORAL | 3 refills | Status: DC

## 2017-09-09 MED ORDER — HYDROXYUREA 500 MG PO CAPS: ORAL_CAPSULE | ORAL | 5 refills | Status: DC

## 2017-09-09 MED ORDER — HYDROXYUREA 500 MG PO CAPS: ORAL_CAPSULE | ORAL | 0 refills | Status: DC

## 2017-09-09 NOTE — Progress Notes (Signed)
    Subjective   Jason Franklin 22 y.o. male  [id number]  [id number]  [birth year]    Chief Complaint  Patient presents with  . Sickle Cell Anemia    Patient presents for a follow up on Sickle cell. Patient is compliant with daily medications except for folate. Patient has not had an eye exam in the past 6 years. No problems or concerns at the present time.   Patient recently graduated from [employer] and is looking for employment.  Review of Systems  Constitutional: Negative.   HENT: Negative.   Eyes: Negative.   Respiratory: Negative.   Cardiovascular: Negative.   Gastrointestinal: Negative.   Musculoskeletal: Positive for myalgias (intermittent and generalized).  Neurological: Negative.   Psychiatric/Behavioral: Negative.     Objective   Physical Exam  Constitutional: He is oriented to person, place, and time. He appears well-developed and well-nourished.  HENT:  Head: Normocephalic and atraumatic.  Right Ear: External ear normal.  Left Ear: External ear normal.  Nose: Nose normal.  Mouth/Throat: Oropharynx is clear and moist.  Eyes: Pupils are equal, round, and reactive to light. Conjunctivae and EOM are normal.  Neck: Normal range of motion. Neck supple. No tracheal deviation present. No thyromegaly present.  Cardiovascular: Normal rate, regular rhythm, normal heart sounds and intact distal pulses. Exam reveals no friction rub.  No murmur heard. Pulmonary/Chest: Effort normal and breath sounds normal. No stridor. No respiratory distress. He has no wheezes.  Abdominal: Soft. Bowel sounds are normal. He exhibits no distension and no mass. There is no tenderness.  Musculoskeletal: Normal range of motion.  Lymphadenopathy:    He has no cervical adenopathy.  Neurological: He is alert and oriented to person, place, and time.  Skin: Skin is warm and dry.  Psychiatric: He has a normal mood and affect. His behavior is normal. Judgment and thought content normal.  Nursing note  and vitals reviewed.   BP 122/73 (BP Location: Left Arm, Patient Position: Sitting, Cuff Size: Normal)   Pulse 85   Temp 98.6 F (37 C) (Oral)   Resp 16   Ht 5\' 9"  (1.753 m)   Wt 163 lb (73.9 kg)   SpO2 100%   BMI 24.07 kg/m   Assessment   Encounter Diagnosis  Name Primary?  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 Hb-SS disease without crisis ([place])  - hydroxyurea (HYDREA) 500 MG capsule; May take with food to minimize GI side effects.  Dispense: 90 capsule; Refill: 0 - folic acid (FOLVITE) 1 MG tablet; Take 1 tablet (1 mg total) by mouth daily.  Dispense: 90 tablet; Refill: 3 - CBC with Differential - Comprehensive metabolic panel - Ferritin - Vitamin D 1,25 dihydroxy     This note has been created with Surveyor, quantity. Any transcriptional errors are unintentional.

## 2017-09-09 NOTE — Patient Instructions (Signed)
Sickle Cell Anemia, Adult °Sickle cell anemia is a condition where your red blood cells are shaped like sickles. Red blood cells carry oxygen through the body. Sickle-shaped red blood cells do not live as long as normal red blood cells. They also clump together and block blood from flowing through the blood vessels. These things prevent the body from getting enough oxygen. Sickle cell anemia causes organ damage and pain. It also increases the risk of infection. °Follow these instructions at home: °· Drink enough fluid to keep your pee (urine) clear or pale yellow. Drink more in hot weather and during exercise. °· Do not smoke. Smoking lowers oxygen levels in the blood. °· Only take over-the-counter or prescription medicines as told by your doctor. °· Take antibiotic medicines as told by your doctor. Make sure you finish them even if you start to feel better. °· Take supplements as told by your doctor. °· Consider wearing a medical alert bracelet. This tells anyone caring for you in an emergency of your condition. °· When traveling, keep your medical information, doctors' names, and the medicines you take with you at all times. °· If you have a fever, do not take fever medicines right away. This could cover up a problem. Tell your doctor. °· Keep all follow-up visits with your doctor. Sickle cell anemia requires regular medical care. °Contact a doctor if: °You have a fever. °Get help right away if: °· You feel dizzy or faint. °· You have new belly (abdominal) pain, especially on the left side near the stomach area. °· You have a lasting, often uncomfortable and painful erection of the penis (priapism). If it is not treated right away, you will become unable to have sex (impotence). °· You have numbness in your arms or legs or you have a hard time moving them. °· You have a hard time talking. °· You have a fever or lasting symptoms for more than 2-3 days. °· You have a fever and your symptoms suddenly get  worse. °· You have signs or symptoms of infection. These include: °? Chills. °? Being more tired than normal (lethargy). °? Irritability. °? Poor eating. °? Throwing up (vomiting). °· You have pain that is not helped with medicine. °· You have shortness of breath. °· You have pain in your chest. °· You are coughing up pus-like or bloody mucus. °· You have a stiff neck. °· Your feet or hands swell or have pain. °· Your belly looks bloated. °· Your joints hurt. °This information is not intended to replace advice given to you by your health care provider. Make sure you discuss any questions you have with your health care provider. °Document Released: 11/24/2012 Document Revised: 07/12/2015 Document Reviewed: 09/15/2012 °Elsevier Interactive Patient Education © 2017 Elsevier Inc. ° °

## 2017-09-14 LAB — COMPREHENSIVE METABOLIC PANEL
ALT: 16 IU/L (ref 0–44)
AST: 18 IU/L (ref 0–40)
Albumin/Globulin Ratio: 1.8 (ref 1.2–2.2)
Albumin: 4.8 g/dL (ref 3.5–5.5)
Alkaline Phosphatase: 55 IU/L (ref 39–117)
BUN/Creatinine Ratio: 14 (ref 9–20)
BUN: 11 mg/dL (ref 6–20)
Bilirubin Total: 0.7 mg/dL (ref 0.0–1.2)
CO2: 24 mmol/L (ref 20–29)
Calcium: 9.5 mg/dL (ref 8.7–10.2)
Chloride: 102 mmol/L (ref 96–106)
Creatinine, Ser: 0.77 mg/dL (ref 0.76–1.27)
GFR calc Af Amer: 147 mL/min/{1.73_m2} (ref >59)
GFR calc non Af Amer: 127 mL/min/{1.73_m2} (ref >59)
Globulin, Total: 2.7 g/dL (ref 1.5–4.5)
Glucose: 87 mg/dL (ref 65–99)
Potassium: 4.7 mmol/L (ref 3.5–5.2)
Sodium: 142 mmol/L (ref 134–144)
Total Protein: 7.5 g/dL (ref 6.0–8.5)

## 2017-09-14 LAB — CBC WITH DIFFERENTIAL/PLATELET
Basophils Absolute: 0 10*3/uL (ref 0.0–0.2)
Basos: 1 %
EOS (ABSOLUTE): 0.1 10*3/uL (ref 0.0–0.4)
Eos: 2 %
Hematocrit: 36.6 % — ABNORMAL LOW (ref 37.5–51.0)
Hemoglobin: 12.6 g/dL — ABNORMAL LOW (ref 13.0–17.7)
Lymphocytes Absolute: 1.6 10*3/uL (ref 0.7–3.1)
Lymphs: 49 %
MCH: 39 pg — ABNORMAL HIGH (ref 26.6–33.0)
MCHC: 34.4 g/dL (ref 31.5–35.7)
MCV: 113 fL — ABNORMAL HIGH (ref 79–97)
Monocytes Absolute: 0.2 10*3/uL (ref 0.1–0.9)
Monocytes: 7 %
NRBC: 23 % — ABNORMAL HIGH (ref 0–0)
Neutrophils Absolute: 1.4 10*3/uL (ref 1.4–7.0)
Neutrophils: 41 %
Platelets: 313 10*3/uL (ref 150–450)
RBC: 3.23 x10E6/uL — ABNORMAL LOW (ref 4.14–5.80)
RDW: 20.7 % — ABNORMAL HIGH (ref 12.3–15.4)
WBC: 3.9 10*3/uL (ref 3.4–10.8)

## 2017-09-14 LAB — VITAMIN D 1,25 DIHYDROXY
Vitamin D 1, 25 (OH)2 Total: 41 pg/mL
Vitamin D2 1, 25 (OH)2: <10 pg/mL
Vitamin D3 1, 25 (OH)2: 40 pg/mL

## 2017-09-14 LAB — FERRITIN: Ferritin: 321 ng/mL (ref 30–400)

## 2017-10-12 ENCOUNTER — Other Ambulatory Visit: Payer: Self-pay

## 2017-10-12 DIAGNOSIS — D571 Sickle-cell disease without crisis: Secondary | ICD-10-CM

## 2017-10-12 MED ORDER — HYDROXYUREA 500 MG PO CAPS: ORAL_CAPSULE | ORAL | 5 refills | Status: DC

## 2018-03-12 ENCOUNTER — Ambulatory Visit (INDEPENDENT_AMBULATORY_CARE_PROVIDER_SITE_OTHER): Payer: Managed Care, Other (non HMO) | Admitting: Family Medicine

## 2018-03-12 ENCOUNTER — Encounter: Payer: Self-pay | Admitting: Family Medicine

## 2018-03-12 VITALS — BP 121/71 | HR 94 | Temp 99.1°F | Resp 14 | Ht 69.0 in | Wt 161.0 lb

## 2018-03-12 DIAGNOSIS — D571 Sickle-cell disease without crisis: Secondary | ICD-10-CM

## 2018-03-12 LAB — POCT URINALYSIS DIPSTICK
Bilirubin, UA: NEGATIVE
Blood, UA: NEGATIVE
Glucose, UA: NEGATIVE
Ketones, UA: NEGATIVE
Leukocytes, UA: NEGATIVE
Nitrite, UA: NEGATIVE
Protein, UA: NEGATIVE
Spec Grav, UA: 1.015 (ref 1.010–1.025)
Urobilinogen, UA: 0.2 E.U./dL
pH, UA: 6.5 (ref 5.0–8.0)

## 2018-03-12 MED ORDER — HYDROXYUREA 500 MG PO CAPS: ORAL_CAPSULE | ORAL | 5 refills | Status: DC

## 2018-03-12 NOTE — Patient Instructions (Signed)
Sickle Cell Anemia, Adult °Sickle cell anemia is a condition where your red blood cells are shaped like sickles. Red blood cells carry oxygen through the body. Sickle-shaped cells do not live as long as normal red blood cells. They also clump together and block blood from flowing through the blood vessels. This prevents the body from getting enough oxygen. Sickle cell anemia causes organ damage and pain. It also increases the risk of infection. °Follow these instructions at home: °Medicines °· Take over-the-counter and prescription medicines only as told by your doctor. °· If you were prescribed an antibiotic medicine, take it as told by your doctor. Do not stop taking the antibiotic even if you start to feel better. °· If you develop a fever, do not take medicines to lower the fever right away. Tell your doctor about the fever. °Managing pain, stiffness, and swelling °· Try these methods to help with pain: °? Use a heating pad. °? Take a warm bath. °? Distract yourself, such as by watching TV. °Eating and drinking °· Drink enough fluid to keep your pee (urine) clear or pale yellow. Drink more in hot weather and during exercise. °· Limit or avoid alcohol. °· Eat a healthy diet. Eat plenty of fruits, vegetables, whole grains, and lean protein. °· Take vitamins and supplements as told by your doctor. °Traveling °· When traveling, keep these with you: °? Your medical information. °? The names of your doctors. °? Your medicines. °· If you need to take an airplane, talk to your doctor first. °Activity °· Rest often. °· Avoid exercises that make your heart beat much faster, such as jogging. °General instructions °· Do not use products that have nicotine or tobacco, such as cigarettes and e-cigarettes. If you need help quitting, ask your doctor. °· Consider wearing a medical alert bracelet. °· Avoid being in high places (high altitudes), such as mountains. °· Avoid very hot or cold temperatures. °· Avoid places where the  temperature changes a lot. °· Keep all follow-up visits as told by your doctor. This is important. °Contact a doctor if: °· A joint hurts. °· Your feet or hands hurt or swell. °· You feel tired (fatigued). °Get help right away if: °· You have symptoms of infection. These include: °? Fever. °? Chills. °? Being very tired. °? Irritability. °? Poor eating. °? Throwing up (vomiting). °· You feel dizzy or faint. °· You have new stomach pain, especially on the left side. °· You have a an erection (priapism) that lasts more than 4 hours. °· You have numbness in your arms or legs. °· You have a hard time moving your arms or legs. °· You have trouble talking. °· You have pain that does not go away when you take medicine. °· You are short of breath. °· You are breathing fast. °· You have a long-term cough. °· You have pain in your chest. °· You have a bad headache. °· You have a stiff neck. °· Your stomach looks bloated even though you did not eat much. °· Your skin is pale. °· You suddenly cannot see well. °Summary °· Sickle cell anemia is a condition where your red blood cells are shaped like sickles. °· Follow your doctor's advice on ways to manage pain, food to eat, activities to do, and steps to take for safe travel. °· Get medical help right away if you have any signs of infection, such as a fever. °This information is not intended to replace advice given to you by your   health care provider. Make sure you discuss any questions you have with your health care provider. °Document Released: 11/24/2012 Document Revised: 03/11/2016 Document Reviewed: 03/11/2016 °Elsevier Interactive Patient Education © 2019 Elsevier Inc. ° °

## 2018-03-12 NOTE — Progress Notes (Signed)
[facility] INTERNAL MEDICINE AND SICKLE CELL CARE  SICKLE CELL ANEMIA FOLLOW UP VISIT PROVIDER: Lanae Boast, FNP    Subjective:   Jason Franklin  is a 23 y.o.  male who  has a past medical history of Asthma and Sickle cell disease ([place]). presents for a follow up for Sickle Cell Anemia. The patient has had 0 admissions in the past 6 months. Patient states that his last crisis was in 2016.  Pain regimen includes: Ibuprofen  Hydrea Therapy: Yes Medication compliance: Yes  Pain today is 0/10. The patient reports adequate daily hydration.     Review of Systems  Constitutional: Negative.   HENT: Negative.   Eyes: Negative.   Respiratory: Negative.   Cardiovascular: Negative.   Gastrointestinal: Negative.   Genitourinary: Negative.   Musculoskeletal: Negative.   Skin: Negative.   Neurological: Negative.   Psychiatric/Behavioral: Negative.     Objective:   Objective  BP 121/71 (BP Location: Left Arm, Patient Position: Sitting, Cuff Size: Normal)   Pulse 94   Temp 99.1 F (37.3 C) (Oral)   Resp 14   Ht 5\' 9"  (1.753 m)   Wt 161 lb (73 kg)   SpO2 100%   BMI 23.78 kg/m   Wt Readings from Last 3 Encounters:  03/12/18 161 lb (73 kg)  09/09/17 163 lb (73.9 kg)  03/11/17 155 lb (70.3 kg)     Physical Exam Vitals signs and nursing note reviewed.  Constitutional:      General: He is not in acute distress.    Appearance: He is well-developed.  HENT:     Head: Normocephalic and atraumatic.  Eyes:     Conjunctiva/sclera: Conjunctivae normal.     Pupils: Pupils are equal, round, and reactive to light.  Neck:     Musculoskeletal: Normal range of motion.  Cardiovascular:     Rate and Rhythm: Normal rate and regular rhythm.     Heart sounds: Normal heart sounds.  Pulmonary:     Effort: Pulmonary effort is normal. No respiratory distress.     Breath sounds: Normal breath sounds.  Abdominal:     General: Bowel sounds are normal. There is no distension.   Palpations: Abdomen is soft.  Musculoskeletal: Normal range of motion.  Skin:    General: Skin is warm and dry.  Neurological:     Mental Status: He is alert and oriented to person, place, and time.  Psychiatric:        Behavior: Behavior normal.        Thought Content: Thought content normal.      Assessment/Plan:   Assessment   Encounter Diagnosis  Name Primary?  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 Hb-SS disease without crisis ([place]) - CBC with Differential - Comprehensive metabolic panel - Ferritin - Vitamin D, 25-hydroxy - hydroxyurea (HYDREA) 500 MG capsule; May take with food to minimize GI side effects.  Dispense: 90 capsule; Refill: 5 - Urinalysis Dipstick   Return to care as scheduled and prn. Patient verbalized understanding and agreed with plan of care.   1. Sickle cell disease - Continue Hydrea   We discussed the need for good hydration, monitoring of hydration status, avoidance of heat, cold, stress, and infection triggers. We discussed the risks and benefits of Hydrea, including bone marrow suppression, the possibility of GI upset, skin ulcers, hair thinning, and teratogenicity. The patient was reminded of the need to seek medical attention of any symptoms of  bleeding, anemia, or infection. Continue folic acid 1 mg daily to prevent aplastic bone marrow crises.   2. Pulmonary evaluation - Patient denies severe recurrent wheezes, shortness of breath with exercise, or persistent cough. If these symptoms develop, pulmonary function tests with spirometry will be ordered, and if abnormal, plan on referral to Pulmonology for further evaluation.  3. Cardiac - Routine screening for pulmonary hypertension is not recommended.  4. Eye - High risk of proliferative retinopathy. Annual eye exam with retinal exam recommended to patient.  5. Immunization status -  Yearly influenza vaccination is recommended, as well as being up to date with Meningococcal and  Pneumococcal vaccines.   6. Acute and chronic painful episodes - We discussed that pt is to receive Schedule II prescriptions only from [place]. Pt is also aware that the prescription history is available to [place] online through the [facility] CSRS. Controlled substance agreement signed. We reminded Jason Franklin that all patients receiving Schedule II narcotics must be seen for follow within one month of prescription being requested. We reviewed the terms of our pain agreement, including the need to keep medicines in a safe locked location away from children or pets, and the need to report excess sedation or constipation, measures to avoid constipation, and policies related to early refills and stolen prescriptions. According to the [place] Chronic Pain Initiative program, we have reviewed details related to analgesia, adverse effects, aberrant behaviors.  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  Not applicable at this time.  If this occurs will use Exjade for management.   8. Vitamin D deficiency - Drisdol 50,000 units weekly. Patient encouraged to take as prescribed.   The above recommendations are taken from the NIH Evidence-Based Management of Sickle Cell Disease: Expert Panel Report, 20149.   Ms. Andr L. Nathaneil Canary, FNP-BC [facility] [address] [place], [place] [address] [phone]  This note has been created with Dragon speech recognition software and smart phrase technology. Any transcriptional errors are unintentional.

## 2018-03-13 LAB — CBC WITH DIFFERENTIAL/PLATELET
Basophils Absolute: 0 10*3/uL (ref 0.0–0.2)
Basos: 1 %
EOS (ABSOLUTE): 0.1 10*3/uL (ref 0.0–0.4)
Eos: 2 %
Hematocrit: 33.5 % — ABNORMAL LOW (ref 37.5–51.0)
Hemoglobin: 11.9 g/dL — ABNORMAL LOW (ref 13.0–17.7)
Immature Grans (Abs): 0 10*3/uL (ref 0.0–0.1)
Immature Granulocytes: 0 %
Lymphocytes Absolute: 1.9 10*3/uL (ref 0.7–3.1)
Lymphs: 50 %
MCH: 39.8 pg — ABNORMAL HIGH (ref 26.6–33.0)
MCHC: 35.5 g/dL (ref 31.5–35.7)
MCV: 112 fL — ABNORMAL HIGH (ref 79–97)
Monocytes Absolute: 0.5 10*3/uL (ref 0.1–0.9)
Monocytes: 12 %
NRBC: 18 % — ABNORMAL HIGH (ref 0–0)
Neutrophils Absolute: 1.3 10*3/uL — ABNORMAL LOW (ref 1.4–7.0)
Neutrophils: 35 %
Platelets: 772 10*3/uL — ABNORMAL HIGH (ref 150–450)
RBC: 2.99 x10E6/uL — ABNORMAL LOW (ref 4.14–5.80)
RDW: 16.8 % — ABNORMAL HIGH (ref 11.6–15.4)
WBC: 3.8 10*3/uL (ref 3.4–10.8)

## 2018-03-13 LAB — COMPREHENSIVE METABOLIC PANEL
ALT: 14 IU/L (ref 0–44)
AST: 16 IU/L (ref 0–40)
Albumin/Globulin Ratio: 1.7 (ref 1.2–2.2)
Albumin: 4.5 g/dL (ref 4.1–5.2)
Alkaline Phosphatase: 53 IU/L (ref 39–117)
BUN/Creatinine Ratio: 11 (ref 9–20)
BUN: 9 mg/dL (ref 6–20)
Bilirubin Total: 0.9 mg/dL (ref 0.0–1.2)
CO2: 24 mmol/L (ref 20–29)
Calcium: 9.6 mg/dL (ref 8.7–10.2)
Chloride: 103 mmol/L (ref 96–106)
Creatinine, Ser: 0.8 mg/dL (ref 0.76–1.27)
GFR calc Af Amer: 145 mL/min/{1.73_m2} (ref >59)
GFR calc non Af Amer: 125 mL/min/{1.73_m2} (ref >59)
Globulin, Total: 2.7 g/dL (ref 1.5–4.5)
Glucose: 78 mg/dL (ref 65–99)
Potassium: 4.9 mmol/L (ref 3.5–5.2)
Sodium: 142 mmol/L (ref 134–144)
Total Protein: 7.2 g/dL (ref 6.0–8.5)

## 2018-03-13 LAB — FERRITIN: Ferritin: 323 ng/mL (ref 30–400)

## 2018-03-13 LAB — VITAMIN D 25 HYDROXY (VIT D DEFICIENCY, FRACTURES): Vit D, 25-Hydroxy: 7.1 ng/mL — ABNORMAL LOW (ref 30.0–100.0)

## 2018-03-15 ENCOUNTER — Telehealth: Payer: Self-pay

## 2018-03-15 MED ORDER — VITAMIN D (ERGOCALCIFEROL) 1.25 MG (50000 UNIT) PO CAPS: 50000.0000 [IU] | ORAL_CAPSULE | ORAL | 1 refills | Status: AC

## 2018-03-15 NOTE — Telephone Encounter (Signed)
Called, no answer. Left a message for patient to return call. Thanks!  

## 2018-03-15 NOTE — Telephone Encounter (Signed)
-----   Message from Lanae Boast, [place] sent at 03/15/2018 12:20 PM EST ----- Low Vitamin D. All other labs are stable. I will send Vit D to the pharmacy.Due to sickle cell, he will need to be on this long term.

## 2018-03-15 NOTE — Addendum Note (Signed)
Addended by: Genelle Bal on: 03/15/2018 12:36 PM   Modules accepted: Orders

## 2018-03-16 NOTE — Telephone Encounter (Signed)
Called, no answer. Left a message for patient to return call. Thanks!  

## 2018-03-16 NOTE — Telephone Encounter (Signed)
Patient returned call and I advised that vitamin D was low and that we have sent in vitamin D supplement that he should take once weekly. Patient verbalized understanding. Thanks!

## 2018-09-10 ENCOUNTER — Ambulatory Visit: Payer: Managed Care, Other (non HMO) | Admitting: Family Medicine

## 2018-09-22 ENCOUNTER — Other Ambulatory Visit: Payer: Self-pay

## 2018-09-22 ENCOUNTER — Encounter: Payer: Self-pay | Admitting: Family Medicine

## 2018-09-22 ENCOUNTER — Ambulatory Visit (INDEPENDENT_AMBULATORY_CARE_PROVIDER_SITE_OTHER): Payer: Managed Care, Other (non HMO) | Admitting: Family Medicine

## 2018-09-22 VITALS — BP 114/75 | HR 86 | Temp 99.2°F | Resp 16 | Ht 69.0 in | Wt 167.0 lb

## 2018-09-22 DIAGNOSIS — D571 Sickle-cell disease without crisis: Secondary | ICD-10-CM

## 2018-09-22 LAB — POCT URINALYSIS DIPSTICK
Bilirubin, UA: NEGATIVE
Blood, UA: NEGATIVE
Glucose, UA: NEGATIVE
Ketones, UA: NEGATIVE
Leukocytes, UA: NEGATIVE
Nitrite, UA: NEGATIVE
Protein, UA: NEGATIVE
Spec Grav, UA: 1.02 (ref 1.010–1.025)
Urobilinogen, UA: 0.2 E.U./dL
pH, UA: 6 (ref 5.0–8.0)

## 2018-09-22 MED ORDER — HYDROXYUREA 500 MG PO CAPS: 1500.0000 mg | ORAL_CAPSULE | Freq: Every day | ORAL | 3 refills | Status: DC

## 2018-09-22 NOTE — Patient Instructions (Signed)
Sickle Cell Anemia, Adult ° °Sickle cell anemia is a condition where your red blood cells are shaped like sickles. Red blood cells carry oxygen through the body. Sickle-shaped cells do not live as long as normal red blood cells. They also clump together and block blood from flowing through the blood vessels. This prevents the body from getting enough oxygen. Sickle cell anemia causes organ damage and pain. It also increases the risk of infection. °Follow these instructions at home: °Medicines °· Take over-the-counter and prescription medicines only as told by your doctor. °· If you were prescribed an antibiotic medicine, take it as told by your doctor. Do not stop taking the antibiotic even if you start to feel better. °· If you develop a fever, do not take medicines to lower the fever right away. Tell your doctor about the fever. °Managing pain, stiffness, and swelling °· Try these methods to help with pain: °? Use a heating pad. °? Take a warm bath. °? Distract yourself, such as by watching TV. °Eating and drinking °· Drink enough fluid to keep your pee (urine) clear or pale yellow. Drink more in hot weather and during exercise. °· Limit or avoid alcohol. °· Eat a healthy diet. Eat plenty of fruits, vegetables, whole grains, and lean protein. °· Take vitamins and supplements as told by your doctor. °Traveling °· When traveling, keep these with you: °? Your medical information. °? The names of your doctors. °? Your medicines. °· If you need to take an airplane, talk to your doctor first. °Activity °· Rest often. °· Avoid exercises that make your heart beat much faster, such as jogging. °General instructions °· Do not use products that have nicotine or tobacco, such as cigarettes and e-cigarettes. If you need help quitting, ask your doctor. °· Consider wearing a medical alert bracelet. °· Avoid being in high places (high altitudes), such as mountains. °· Avoid very hot or cold temperatures. °· Avoid places where the  temperature changes a lot. °· Keep all follow-up visits as told by your doctor. This is important. °Contact a doctor if: °· A joint hurts. °· Your feet or hands hurt or swell. °· You feel tired (fatigued). °Get help right away if: °· You have symptoms of infection. These include: °? Fever. °? Chills. °? Being very tired. °? Irritability. °? Poor eating. °? Throwing up (vomiting). °· You feel dizzy or faint. °· You have new stomach pain, especially on the left side. °· You have a an erection (priapism) that lasts more than 4 hours. °· You have numbness in your arms or legs. °· You have a hard time moving your arms or legs. °· You have trouble talking. °· You have pain that does not go away when you take medicine. °· You are short of breath. °· You are breathing fast. °· You have a long-term cough. °· You have pain in your chest. °· You have a bad headache. °· You have a stiff neck. °· Your stomach looks bloated even though you did not eat much. °· Your skin is pale. °· You suddenly cannot see well. °Summary °· Sickle cell anemia is a condition where your red blood cells are shaped like sickles. °· Follow your doctor's advice on ways to manage pain, food to eat, activities to do, and steps to take for safe travel. °· Get medical help right away if you have any signs of infection, such as a fever. °This information is not intended to replace advice given to you by   your health care provider. Make sure you discuss any questions you have with your health care provider. °Document Released: 11/24/2012 Document Revised: 05/28/2018 Document Reviewed: 03/11/2016 °Elsevier Patient Education © 2020 Elsevier Inc. ° °

## 2018-09-22 NOTE — Progress Notes (Signed)
PATIENT [facility]ARE CENTER INTERNAL MEDICINE AND SICKLE CELL CARE  SICKLE CELL ANEMIA FOLLOW UP VISIT PROVIDER: Lanae Boast, FNP    Subjective:   Jason Franklin  is a 23 y.o.  male who  has a past medical history of Asthma and Sickle cell disease ([place]). presents for a follow up for Sickle Cell Anemia. The patient has had 0 admissions in the past 6 months.  Pain regimen includes: Ibuprofen  Hydrea Therapy: Yes Medication compliance: Yes  Pain today is 0/10.  The patient reports adequate daily hydration.   Review of Systems  Constitutional: Negative.   HENT: Negative.   Eyes: Negative.   Respiratory: Negative.   Cardiovascular: Negative.   Gastrointestinal: Negative.   Genitourinary: Negative.   Musculoskeletal: Negative.   Skin: Negative.   Neurological: Negative.   Psychiatric/Behavioral: Negative.     Objective:   Objective  BP 114/75 (BP Location: Left Arm, Patient Position: Sitting, Cuff Size: Normal)   Pulse 86   Temp 99.2 F (37.3 C) (Oral)   Resp 16   Ht 5\' 9"  (1.753 m)   Wt 167 lb (75.8 kg)   SpO2 99%   BMI 24.66 kg/m   Wt Readings from Last 3 Encounters:  09/22/18 167 lb (75.8 kg)  03/12/18 161 lb (73 kg)  09/09/17 163 lb (73.9 kg)     Physical Exam Vitals signs and nursing note reviewed.  Constitutional:      General: He is not in acute distress.    Appearance: Normal appearance.  HENT:     Head: Normocephalic and atraumatic.  Eyes:     Extraocular Movements: Extraocular movements intact.     Conjunctiva/sclera: Conjunctivae normal.     Pupils: Pupils are equal, round, and reactive to light.  Cardiovascular:     Rate and Rhythm: Normal rate and regular rhythm.     Heart sounds: No murmur.  Pulmonary:     Effort: Pulmonary effort is normal.     Breath sounds: Normal breath sounds.  Musculoskeletal: Normal range of motion.  Skin:    General: Skin is warm and dry.  Neurological:     Mental Status: He is alert and oriented to person, place,  and time.  Psychiatric:        Mood and Affect: Mood normal.        Behavior: Behavior normal.        Thought Content: Thought content normal.        Judgment: Judgment normal.      Assessment/Plan:   Assessment   Encounter Diagnosis  Name Primary?  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 Hb-SS disease without crisis ([place]) - Urinalysis Dipstick - hydroxyurea (HYDREA) 500 MG capsule; Take 3 capsules (1,500 mg total) by mouth daily. May take with food to minimize GI side effects.  Dispense: 270 capsule; Refill: 3 - CBC with Differential - Comprehensive metabolic panel   Return to care as scheduled and prn. Patient verbalized understanding and agreed with plan of care.   1. Sickle cell disease - Continue Hydrea   We discussed the need for good hydration, monitoring of hydration status, avoidance of heat, cold, stress, and infection triggers. We discussed the risks and benefits of Hydrea, including bone marrow suppression, the possibility of GI upset, skin ulcers, hair thinning, and teratogenicity. The patient was reminded of the need to seek medical attention of any symptoms of bleeding, anemia, or infection. Continue folic acid 1 mg daily to prevent  aplastic bone marrow crises.   2. Pulmonary evaluation - Patient denies severe recurrent wheezes, shortness of breath with exercise, or persistent cough. If these symptoms develop, pulmonary function tests with spirometry will be ordered, and if abnormal, plan on referral to Pulmonology for further evaluation.  3. Cardiac - Routine screening for pulmonary hypertension is not recommended.  4. Eye - High risk of proliferative retinopathy. Annual eye exam with retinal exam recommended to patient.  5. Immunization status -  Yearly influenza vaccination is recommended, as well as being up to date with Meningococcal and Pneumococcal vaccines.   6. Acute and chronic painful episodes - We discussed that pt is to receive Schedule  II prescriptions only from [place]. Pt is also aware that the prescription history is available to [place] online through the [facility] CSRS. Controlled substance agreement signed. We reminded PERRIS CONWELL that all patients receiving Schedule II narcotics must be seen for follow within one month of prescription being requested. We reviewed the terms of our pain agreement, including the need to keep medicines in a safe locked location away from children or pets, and the need to report excess sedation or constipation, measures to avoid constipation, and policies related to early refills and stolen prescriptions. According to the [place] Chronic Pain Initiative program, we have reviewed details related to analgesia, adverse effects, aberrant behaviors.  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  Not applicable at this time.  If this occurs will use Exjade for management.   8. Vitamin D deficiency - Drisdol 50,000 units weekly. Patient encouraged to take as prescribed.   The above recommendations are taken from the NIH Evidence-Based Management of Sickle Cell Disease: Expert Panel Report, 20149.   Ms. Andr L. Nathaneil Canary, FNP-BC [facility] [address] [place], [place] [address] [phone]  This note has been created with Dragon speech recognition software and smart phrase technology. Any transcriptional errors are unintentional.

## 2018-09-23 LAB — COMPREHENSIVE METABOLIC PANEL
ALT: 29 IU/L (ref 0–44)
AST: 34 IU/L (ref 0–40)
Albumin/Globulin Ratio: 1.9 (ref 1.2–2.2)
Albumin: 5 g/dL (ref 4.1–5.2)
Alkaline Phosphatase: 59 IU/L (ref 39–117)
BUN/Creatinine Ratio: 11 (ref 9–20)
BUN: 9 mg/dL (ref 6–20)
Bilirubin Total: 0.5 mg/dL (ref 0.0–1.2)
CO2: 18 mmol/L — ABNORMAL LOW (ref 20–29)
Calcium: 10.2 mg/dL (ref 8.7–10.2)
Chloride: 102 mmol/L (ref 96–106)
Creatinine, Ser: 0.82 mg/dL (ref 0.76–1.27)
GFR calc Af Amer: 142 mL/min/{1.73_m2} (ref >59)
GFR calc non Af Amer: 123 mL/min/{1.73_m2} (ref >59)
Globulin, Total: 2.7 g/dL (ref 1.5–4.5)
Glucose: 89 mg/dL (ref 65–99)
Potassium: 5.4 mmol/L — ABNORMAL HIGH (ref 3.5–5.2)
Sodium: 141 mmol/L (ref 134–144)
Total Protein: 7.7 g/dL (ref 6.0–8.5)

## 2018-09-23 LAB — CBC WITH DIFFERENTIAL/PLATELET
Basophils Absolute: 0 10*3/uL (ref 0.0–0.2)
Basos: 0 %
EOS (ABSOLUTE): 0.1 10*3/uL (ref 0.0–0.4)
Eos: 2 %
Hematocrit: 36.7 % — ABNORMAL LOW (ref 37.5–51.0)
Hemoglobin: 13.1 g/dL (ref 13.0–17.7)
Immature Grans (Abs): 0 10*3/uL (ref 0.0–0.1)
Immature Granulocytes: 0 %
Lymphocytes Absolute: 3 10*3/uL (ref 0.7–3.1)
Lymphs: 50 %
MCH: 38.8 pg — ABNORMAL HIGH (ref 26.6–33.0)
MCHC: 35.7 g/dL (ref 31.5–35.7)
MCV: 109 fL — ABNORMAL HIGH (ref 79–97)
Monocytes Absolute: 0.5 10*3/uL (ref 0.1–0.9)
Monocytes: 8 %
NRBC: 24 % — ABNORMAL HIGH (ref 0–0)
Neutrophils Absolute: 2.4 10*3/uL (ref 1.4–7.0)
Neutrophils: 40 %
Platelets: 551 10*3/uL — ABNORMAL HIGH (ref 150–450)
RBC: 3.38 x10E6/uL — ABNORMAL LOW (ref 4.14–5.80)
RDW: 17.2 % — ABNORMAL HIGH (ref 11.6–15.4)
WBC: 6 10*3/uL (ref 3.4–10.8)

## 2018-10-01 NOTE — Progress Notes (Signed)
Your labs are stable. Continue with your current medications. Please remember to keep your follow up appointment. If you have problems, questions or concerns, please make an appointment to discuss. Thanks!

## 2018-10-06 ENCOUNTER — Telehealth: Payer: Self-pay

## 2018-10-06 NOTE — Telephone Encounter (Signed)
-----   Message from Lanae Boast, [place] sent at 10/01/2018  4:32 PM EDT ----- Your labs are stable. Continue with your current medications. Please remember to keep your follow up appointment. If you have problems, questions or concerns, please make an appointment to discuss. Thanks!

## 2018-10-06 NOTE — Telephone Encounter (Signed)
Called and spoke with patient, advised that labs are stable and to continue current medication and keep next scheduled appointment. Thanks !

## 2018-10-27 ENCOUNTER — Encounter (HOSPITAL_COMMUNITY): Payer: Self-pay

## 2018-10-27 ENCOUNTER — Encounter (HOSPITAL_COMMUNITY): Payer: Self-pay | Admitting: *Deleted

## 2019-03-25 ENCOUNTER — Encounter: Payer: Self-pay | Admitting: Nurse Practitioner

## 2019-03-25 ENCOUNTER — Other Ambulatory Visit: Payer: Self-pay

## 2019-03-25 ENCOUNTER — Ambulatory Visit (INDEPENDENT_AMBULATORY_CARE_PROVIDER_SITE_OTHER): Payer: Managed Care, Other (non HMO) | Admitting: Nurse Practitioner

## 2019-03-25 VITALS — BP 132/75 | HR 78 | Temp 98.6°F | Resp 14 | Ht 69.0 in | Wt 177.0 lb

## 2019-03-25 DIAGNOSIS — E559 Vitamin D deficiency, unspecified: Secondary | ICD-10-CM

## 2019-03-25 DIAGNOSIS — D571 Sickle-cell disease without crisis: Secondary | ICD-10-CM | POA: Diagnosis not present

## 2019-03-25 LAB — POCT URINALYSIS DIPSTICK
Bilirubin, UA: NEGATIVE
Blood, UA: NEGATIVE
Glucose, UA: NEGATIVE
Ketones, UA: NEGATIVE
Leukocytes, UA: NEGATIVE
Nitrite, UA: NEGATIVE
Protein, UA: NEGATIVE
Spec Grav, UA: 1.02 (ref 1.010–1.025)
Urobilinogen, UA: 1 E.U./dL
pH, UA: 7 (ref 5.0–8.0)

## 2019-03-25 MED ORDER — FOLIC ACID 1 MG PO TABS: 1.0000 mg | ORAL_TABLET | Freq: Every day | ORAL | 3 refills | Status: DC

## 2019-03-25 MED ORDER — HYDROXYUREA 500 MG PO CAPS: 1500.0000 mg | ORAL_CAPSULE | Freq: Every day | ORAL | 3 refills | Status: DC

## 2019-03-25 NOTE — Progress Notes (Signed)
Established Patient Office Visit  Subjective:  Patient ID: Jason Franklin, male    DOB: 27-Aug-1995  Age: 24 y.o. MRN: [id number]  CC:  Chief Complaint  Patient presents with  . Sickle Cell Anemia    HPI [name] presents for follow-up.  He has a history of sickle cell anemia.  He is currently on folic acid and hydroxyurea.  He admits that he is doing well.  He denies any current concerns today.  He tries to take his medication as directed eat well and stay hydrated.   Past Medical History:  Diagnosis Date  . Asthma   . Sickle cell disease ([place])     Past Surgical History:  Procedure Laterality Date  . SPLENECTOMY      Family History  Problem Relation Age of Onset  . Sickle cell trait Mother   . Sickle cell trait Father   . Diabetes Mellitus II Paternal Grandmother     Social History   Socioeconomic History  . Marital status: Single    Spouse name: Not on file  . Number of children: Not on file  . Years of education: Not on file  . Highest education level: Not on file  Occupational History  . Not on file  Tobacco Use  . Smoking status: Never Smoker  . Smokeless tobacco: Former Systems developer  . Tobacco comment: vapor   Substance and Sexual Activity  . Alcohol use: Yes    Comment: occassional  . Drug use: Yes    Types: Marijuana  . Sexual activity: Yes    Birth control/protection: Condom  Other Topics Concern  . Not on file  Social History Narrative  . Not on file   Social Determinants of Health   Financial Resource Strain:   . Difficulty of Paying Living Expenses: Not on file  Food Insecurity:   . Worried About Charity fundraiser in the Last Year: Not on file  . Ran Out of Food in the Last Year: Not on file  Transportation Needs:   . Lack of Transportation (Medical): Not on file  . Lack of Transportation (Non-Medical): Not on file  Physical Activity:   . Days of Exercise per Week: Not on file  . Minutes of Exercise per Session: Not on file   Stress:   . Feeling of Stress : Not on file  Social Connections:   . Frequency of Communication with Friends and Family: Not on file  . Frequency of Social Gatherings with Friends and Family: Not on file  . Attends Religious Services: Not on file  . Active Member of Clubs or Organizations: Not on file  . Attends Archivist Meetings: Not on file  . Marital Status: Not on file  Intimate Partner Violence:   . Fear of Current or Ex-Partner: Not on file  . Emotionally Abused: Not on file  . Physically Abused: Not on file  . Sexually Abused: Not on file    Outpatient Medications Prior to Visit  Medication Sig Dispense Refill  . folic acid (FOLVITE) 1 MG tablet Take 1 tablet (1 mg total) by mouth daily. 90 tablet 3  . hydroxyurea (HYDREA) 500 MG capsule Take 3 capsules (1,500 mg total) by mouth daily. May take with food to minimize GI side effects. 270 capsule 3   No facility-administered medications prior to visit.    No Known Allergies  ROS Review of Systems  All other systems reviewed and are negative.     Objective:  Physical Exam  Constitutional: He is oriented to person, place, and time. He appears well-developed.  HENT:  Head: Normocephalic.  Cardiovascular: Normal rate, regular rhythm and normal heart sounds.  Pulmonary/Chest: Effort normal and breath sounds normal.  Abdominal: Soft. Bowel sounds are normal.  Musculoskeletal:        General: Normal range of motion.     Cervical back: Normal range of motion and neck supple.  Neurological: He is alert and oriented to person, place, and time.  Skin: Skin is warm and dry.  Psychiatric: He has a normal mood and affect. His behavior is normal. Judgment and thought content normal.    BP 132/75 (BP Location: Left Arm, Patient Position: Sitting, Cuff Size: Normal)   Pulse 78   Temp 98.6 F (37 C) (Oral)   Resp 14   Ht 5\' 9"  (1.753 m)   Wt 177 lb (80.3 kg)   SpO2 100%   BMI 26.14 kg/m  Wt Readings from  Last 3 Encounters:  03/25/19 177 lb (80.3 kg)  09/22/18 167 lb (75.8 kg)  03/12/18 161 lb (73 kg)     There are no preventive care reminders to display for this patient.  There are no preventive care reminders to display for this patient.  No results found for: TSH Lab Results  Component Value Date   WBC 4.9 03/25/2019   HGB 13.0 03/25/2019   HCT 36.4 (L) 03/25/2019   MCV 113 (H) 03/25/2019   PLT 127 (L) 03/25/2019   Lab Results  Component Value Date   NA 140 03/25/2019   K 3.9 03/25/2019   CO2 22 03/25/2019   GLUCOSE 84 03/25/2019   BUN 8 03/25/2019   CREATININE 0.78 03/25/2019   BILITOT 0.8 03/25/2019   ALKPHOS 59 03/25/2019   AST 25 03/25/2019   ALT 19 03/25/2019   PROT 6.9 03/25/2019   ALBUMIN 4.5 03/25/2019   CALCIUM 9.0 03/25/2019   ANIONGAP 7 04/05/2016   No results found for: CHOL No results found for: HDL No results found for: LDLCALC No results found for: TRIG No results found for: CHOLHDL No results found for: HGBA1C    Assessment & Plan:   Problem List Items Addressed This Visit      Unprioritized   Sickle cell anemia (HCC) - Primary   Relevant Medications   folic acid (FOLVITE) 1 MG tablet   hydroxyurea (HYDREA) 500 MG capsule   Other Relevant Orders   Urinalysis Dipstick (Completed)   Sickle Cell Panel (Completed)    Other Visit Diagnoses    Vitamin D deficiency          Meds ordered this encounter  Medications  . folic acid (FOLVITE) 1 MG tablet    Sig: Take 1 tablet (1 mg total) by mouth daily.    Dispense:  90 tablet    Refill:  3    Order Specific Question:   Supervising Provider    Answer:   Tresa Garter [id number]  . hydroxyurea (HYDREA) 500 MG capsule    Sig: Take 3 capsules (1,500 mg total) by mouth daily. May take with food to minimize GI side effects.    Dispense:  270 capsule    Refill:  3    Order Specific Question:   Supervising Provider    Answer:   Tresa Garter [id number]    Follow-up: Return in  about 6 months (around 09/22/2019).    Vevelyn Francois, NP

## 2019-03-26 LAB — CMP14+CBC/D/PLT+FER+RETIC+V...
ALT: 19 IU/L (ref 0–44)
AST: 25 IU/L (ref 0–40)
Albumin/Globulin Ratio: 1.9 (ref 1.2–2.2)
Albumin: 4.5 g/dL (ref 4.1–5.2)
Alkaline Phosphatase: 59 IU/L (ref 39–117)
BUN/Creatinine Ratio: 10 (ref 9–20)
BUN: 8 mg/dL (ref 6–20)
Basophils Absolute: 0 10*3/uL (ref 0.0–0.2)
Basos: 1 %
Bilirubin Total: 0.8 mg/dL (ref 0.0–1.2)
CO2: 22 mmol/L (ref 20–29)
Calcium: 9 mg/dL (ref 8.7–10.2)
Chloride: 104 mmol/L (ref 96–106)
Creatinine, Ser: 0.78 mg/dL (ref 0.76–1.27)
EOS (ABSOLUTE): 0.1 10*3/uL (ref 0.0–0.4)
Eos: 2 %
Ferritin: 442 ng/mL — ABNORMAL HIGH (ref 30–400)
GFR calc Af Amer: 145 mL/min/{1.73_m2} (ref >59)
GFR calc non Af Amer: 125 mL/min/{1.73_m2} (ref >59)
Globulin, Total: 2.4 g/dL (ref 1.5–4.5)
Glucose: 84 mg/dL (ref 65–99)
Hematocrit: 36.4 % — ABNORMAL LOW (ref 37.5–51.0)
Hemoglobin: 13 g/dL (ref 13.0–17.7)
Immature Grans (Abs): 0 10*3/uL (ref 0.0–0.1)
Immature Granulocytes: 0 %
Lymphocytes Absolute: 2.3 10*3/uL (ref 0.7–3.1)
Lymphs: 47 %
MCH: 40.2 pg — ABNORMAL HIGH (ref 26.6–33.0)
MCHC: 35.7 g/dL (ref 31.5–35.7)
MCV: 113 fL — ABNORMAL HIGH (ref 79–97)
Monocytes Absolute: 0.5 10*3/uL (ref 0.1–0.9)
Monocytes: 10 %
NRBC: 27 % — ABNORMAL HIGH (ref 0–0)
Neutrophils Absolute: 1.9 10*3/uL (ref 1.4–7.0)
Neutrophils: 40 %
Platelets: 127 10*3/uL — ABNORMAL LOW (ref 150–450)
Potassium: 3.9 mmol/L (ref 3.5–5.2)
RBC: 3.23 x10E6/uL — ABNORMAL LOW (ref 4.14–5.80)
RDW: 16.5 % — ABNORMAL HIGH (ref 11.6–15.4)
Retic Ct Pct: 4.3 % — ABNORMAL HIGH (ref 0.6–2.6)
Sodium: 140 mmol/L (ref 134–144)
Total Protein: 6.9 g/dL (ref 6.0–8.5)
Vit D, 25-Hydroxy: 12.7 ng/mL — ABNORMAL LOW (ref 30.0–100.0)
WBC: 4.9 10*3/uL (ref 3.4–10.8)

## 2019-03-28 ENCOUNTER — Other Ambulatory Visit: Payer: Self-pay | Admitting: Nurse Practitioner

## 2019-03-28 ENCOUNTER — Telehealth: Payer: Self-pay

## 2019-03-28 DIAGNOSIS — E559 Vitamin D deficiency, unspecified: Secondary | ICD-10-CM

## 2019-03-28 MED ORDER — ERGOCALCIFEROL 1.25 MG (50000 UT) PO CAPS: 50000.0000 [IU] | ORAL_CAPSULE | ORAL | 0 refills | Status: AC

## 2019-03-28 NOTE — Telephone Encounter (Signed)
-----   Message from Vevelyn Francois, NP sent at 03/28/2019  2:09 PM EST ----- Overall labs were okay RBCs slightly decreased 3.23 hemoglobin within normal 13 range hematocrit slightly decreased at 36.4.  Reticulocyte count slightly elevated.Vitamin D deficiency we will send in high-dose vitamin D 50,000 units weekly x12, then have the patient to start daily vitamin D supplement of at least 2000 units

## 2019-03-28 NOTE — Telephone Encounter (Signed)
Called, no answer. Left a message to call back. Thanks!  

## 2019-03-29 NOTE — Telephone Encounter (Signed)
Called and spoke with patient, advised that overall labs were ok. Advised that RBC's and hematocrit were slightly decreased. Advised that we will follow up at next visit. Advised that vitamin D was low and to take 50,000 units once weekly for 12 weeks then start vitamin D otc 2000 units daily. Patient verbalized understanding. Thanks!

## 2019-05-06 ENCOUNTER — Ambulatory Visit: Payer: Managed Care, Other (non HMO) | Attending: Internal Medicine

## 2019-05-06 DIAGNOSIS — Z23 Encounter for immunization: Secondary | ICD-10-CM

## 2019-05-06 NOTE — Progress Notes (Signed)
   Covid-19 Vaccination Clinic  Name:  MATEEN VANZEELAND    MRN: [id number] DOB: 27-Feb-1995  05/06/2019  Mr. Kupec was observed post Covid-19 immunization for 15 minutes without incident. He was provided with Vaccine Information Sheet and instruction to access the V-Safe system.   Mr. Huebsch was instructed to call 911 with any severe reactions post vaccine: Marland Kitchen Difficulty breathing  . Swelling of face and throat  . A fast heartbeat  . A bad rash all over body  . Dizziness and weakness   Immunizations Administered    Name Date Dose VIS Date Route   Pfizer COVID-19 Vaccine 05/06/2019 11:29 AM 0.3 mL 01/28/2019 Intramuscular   Manufacturer: [place]   Lot: [id number]   [place]: [id number]

## 2019-06-01 ENCOUNTER — Ambulatory Visit: Payer: Managed Care, Other (non HMO) | Attending: Internal Medicine

## 2019-06-01 DIAGNOSIS — Z23 Encounter for immunization: Secondary | ICD-10-CM

## 2019-06-01 NOTE — Progress Notes (Signed)
   Covid-19 Vaccination Clinic  Name:  Jason Franklin    MRN: [id number] DOB: 08-Oct-1995  06/01/2019  [name] was observed post Covid-19 immunization for 15 minutes without incident. He was provided with Vaccine Information Sheet and instruction to access the V-Safe system.   [name] was instructed to call 911 with any severe reactions post vaccine: Marland Kitchen Difficulty breathing  . Swelling of face and throat  . A fast heartbeat  . A bad rash all over body  . Dizziness and weakness   Immunizations Administered    Name Date Dose VIS Date Route   Pfizer COVID-19 Vaccine 06/01/2019 10:15 AM 0.3 mL 01/28/2019 Intramuscular   Manufacturer: [place]   Lot: [id number]   [place]: [id number]

## 2019-06-15 ENCOUNTER — Telehealth: Payer: Self-pay | Admitting: Nurse Practitioner

## 2019-06-15 NOTE — Telephone Encounter (Signed)
Called patient ,voicemail was full.

## 2019-06-15 NOTE — Telephone Encounter (Signed)
Called x2 patient didn't answer not able to lvm  full voicemail

## 2019-06-15 NOTE — Telephone Encounter (Signed)
Spoke with patient change the pharmacy in computer.

## 2019-09-22 ENCOUNTER — Ambulatory Visit (INDEPENDENT_AMBULATORY_CARE_PROVIDER_SITE_OTHER): Payer: Managed Care, Other (non HMO) | Admitting: Nurse Practitioner

## 2019-09-22 ENCOUNTER — Encounter: Payer: Self-pay | Admitting: Nurse Practitioner

## 2019-09-22 ENCOUNTER — Other Ambulatory Visit: Payer: Self-pay

## 2019-09-22 VITALS — BP 124/63 | HR 82 | Temp 99.2°F | Resp 16 | Ht 69.0 in | Wt 176.0 lb

## 2019-09-22 DIAGNOSIS — D571 Sickle-cell disease without crisis: Secondary | ICD-10-CM

## 2019-09-22 DIAGNOSIS — Z840 Family history of diseases of the skin and subcutaneous tissue: Secondary | ICD-10-CM | POA: Diagnosis not present

## 2019-09-22 LAB — POCT URINALYSIS DIPSTICK
Bilirubin, UA: NEGATIVE
Blood, UA: NEGATIVE
Glucose, UA: NEGATIVE
Ketones, UA: NEGATIVE
Leukocytes, UA: NEGATIVE
Nitrite, UA: NEGATIVE
Protein, UA: NEGATIVE
Spec Grav, UA: 1.01 (ref 1.010–1.025)
Urobilinogen, UA: 1 E.U./dL
pH, UA: 6 (ref 5.0–8.0)

## 2019-09-22 NOTE — Patient Instructions (Signed)
Systemic Lupus Erythematosus, Adult  Systemic lupus erythematosus (SLE) is a long-term (chronic) disease that can affect many parts of the body. SLE is an autoimmune disease. With this type of disease, the body's defense system (immune system) mistakenly attacks healthy tissues. This can cause damage to the skin, joints, blood vessels, brain, kidneys, lungs, heart, and other internal organs. It causes pain, irritation, and inflammation.  What are the causes?  The cause of this condition is not known.  What increases the risk?  The following factors may make you more likely to develop this condition:  · Being male.  · Being of Asian, Hispanic, or African-American descent.  · Having a family history of the condition.  · Being exposed to tobacco smoke or smoking cigarettes.  · Having an infection with a virus, such as Epstein-Barr virus.  · Having a history of exposure to silica dust, metals, chemicals, mold or mildew, or insecticides.  · Using oral contraceptives or hormone replacement therapy.  What are the signs or symptoms?  This condition can affect almost any organ or system in the body. Symptoms of the condition depend on which organ or system is affected.  The most common symptoms include:  · Fever.  · Fatigue.  · Weight loss.  · Muscle aches.  · Joint pain.  · Skin rashes, especially over the nose and cheeks (butterfly rash) and after sun exposure.  Symptoms can come and go. A period of time when symptoms get worse or come back is called a flare. A period of time with no symptoms is called a remission.  How is this diagnosed?  This condition is diagnosed based on:  · Your symptoms.  · Your medical history.  · A physical exam.  You may also have tests, including:  · Blood tests.  · Urine tests.  · A chest X-ray.  You may be referred to an autoimmune disease specialist (rheumatologist).  How is this treated?  There is no cure for this condition, but treatment can help to control symptoms, prevent flares (keep  symptoms in remission), and prevent damage to the heart, lungs, kidneys, and other organs. Treatment will depend on what symptoms you are having and what organs or systems are affected. Treatment may involve taking a combination of medicines over time.  Common medicines used to treat this condition include:  · Antimalarial medicines to control symptoms, prevent flares, and protect against organ damage.  · Corticosteroids and NSAIDs to reduce inflammation.  · Medicines to weaken your immune system (immunosuppressants).  · Biologic response modifiers to reduce inflammation and damage.  Follow these instructions at home:  Eating and drinking  · Eat a heart-healthy diet. This may include:  ? Eating high-fiber foods, such as fresh fruits and vegetables, whole grains, and beans.  ? Eating heart-healthy fats (omega-3 fats), such as fish, flaxseed, and flaxseed oil.  ? Limiting foods that are high in saturated fat and cholesterol, such as processed and fried foods, fatty meat, and full-fat dairy.  ? Limiting how much salt (sodium) you eat.  · Include calcium and vitamin D in your diet. Good sources of calcium and vitamin D include:  ? Low-fat dairy products such as milk, yogurt, and cheese.  ? Certain fish, such as fresh or canned salmon, tuna, and sardines.  ? Products that have calcium and vitamin D added to them (fortified products), such as fortified cereals or juice.  Medicines  · Take over-the-counter and prescription medicines only as told by your health   that contain nicotine or tobacco, such as cigarettes and e-cigarettes. If you need help quitting, ask your health care provider.  Protect your skin from the sun by applying  sunblock and wearing protective hats and clothing.  Learn as much as you can about your condition and have a good support system in place. Support may come from family, friends, or a lupus support group. General instructions  Work closely with all of your health care providers to manage your condition.  Stay up to date on all vaccines as directed by your health care provider.  Keep all follow-up visits as told by your health care provider. This is important. Contact a health care provider if:  You have a fever.  Your symptoms flare.  You develop new symptoms.  You have bloody, foamy, or coffee-colored urine.  There are changes in your urination. For example, you urinate more often at night.  You think that you may be depressed or have anxiety.  You become pregnant or plan to become pregnant. Pregnancy in women with this condition is considered high risk. Get help right away if:  You have chest pain.  You have trouble breathing.  You have a seizure.  You suddenly get a very bad headache.  You suddenly develop facial or body weakness.  You cannot speak.  You cannot understand speech. These symptoms may represent a serious problem that is an emergency. Do not wait to see if the symptoms will go away. Get medical help right away. Call your local emergency services (911 in the [place] Do not drive yourself to the hospital. Summary  Systemic lupus erythematosus (SLE) is a long-term disease that can affect many parts of the body.  SLE is an autoimmune disease. That means your body's defense system (immune system) mistakenly attacks healthy tissues.  There is no cure for this condition, but treatment can help to control symptoms, prevent flares, and prevent damage to your organs. Treatment may involve taking a combination of medicines over time. This information is not intended to replace advice given to you by your health care provider. Make sure you discuss any questions you  have with your health care provider. Document Revised: 03/19/2017 Document Reviewed: 03/13/2017 Elsevier Patient Education  2020 Elsevier Inc.  

## 2019-09-22 NOTE — Progress Notes (Signed)
[place], [place]  [address] Phone:  [phone]   Fax:  [phone]   Established Patient Office Visit  Subjective:  Patient ID: Jason Franklin, male    DOB: 1995/09/05  Age: 24 y.o. MRN: [id number]  CC:  Chief Complaint  Patient presents with  . Sickle Cell Anemia  . Follow-up    wants to know if he can be tested for lupus     HPI   Jason Franklin is a 24 y.o. male who  has a past medical history of Asthma and Sickle cell disease ([place]).  Is in today for follow up but admits that his mother was recently diagnosed with SLE. She has requested that all her children are tested.  Patient deniesalopecia, arthralgia, bleeding/clotting problems, depression, fatigue, fevers, joint pain, memory loss, morning stiffness, muscle weakness, nausea, new headache, nodules, oral ulcers, palpitations, pleurisy, polydypsia, polyuria, rashes/photosensitive, Raynaud's and seizures. He admits that he is not having any pain. 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 On his off days he goes to the gym. He denies any intolerance. He admits that he is going to schedule an eye apt.   Past Medical History:  Diagnosis Date  . Asthma   . Sickle cell disease ([place])     Past Surgical History:  Procedure Laterality Date  . SPLENECTOMY      Family History  Problem Relation Age of Onset  . Sickle cell trait Mother   . Sickle cell trait Father   . Diabetes Mellitus II Paternal Grandmother     Social History   Socioeconomic History  . Marital status: Single    Spouse name: Not on file  . Number of children: Not on file  . Years of education: Not on file  . Highest education level: Not on file  Occupational History  . Not on file  Tobacco Use  . Smoking status: Never Smoker  . Smokeless tobacco: Former Systems developer  . Tobacco comment: vapor   Vaping Use  . Vaping Use: Some days  Substance and Sexual Activity  . Alcohol use: Yes    Comment: occassional  . Drug  use: Yes    Types: Marijuana  . Sexual activity: Yes    Birth control/protection: Condom  Other Topics Concern  . Not on file  Social History Narrative  . Not on file   Social Determinants of Health   Financial Resource Strain:   . Difficulty of Paying Living Expenses:   Food Insecurity:   . Worried About Charity fundraiser in the Last Year:   . Arboriculturist in the Last Year:   Transportation Needs:   . Film/video editor (Medical):   Marland Kitchen Lack of Transportation (Non-Medical):   Physical Activity:   . Days of Exercise per Week:   . Minutes of Exercise per Session:   Stress:   . Feeling of Stress :   Social Connections:   . Frequency of Communication with Friends and Family:   . Frequency of Social Gatherings with Friends and Family:   . Attends Religious Services:   . Active Member of Clubs or Organizations:   . Attends Archivist Meetings:   Marland Kitchen Marital Status:   Intimate Partner Violence:   . Fear of Current or Ex-Partner:   . Emotionally Abused:   Marland Kitchen Physically Abused:   . Sexually Abused:     Outpatient Medications Prior to Visit  Medication  Sig Dispense Refill  . hydroxyurea (HYDREA) 500 MG capsule Take 3 capsules (1,500 mg total) by mouth daily. May take with food to minimize GI side effects. 818 capsule 3  . folic acid (FOLVITE) 1 MG tablet Take 1 tablet (1 mg total) by mouth daily. (Patient not taking: Reported on 09/22/2019) 90 tablet 3   No facility-administered medications prior to visit.    No Known Allergies  ROS Review of Systems    Objective:    Physical Exam Constitutional:      General: He is not in acute distress.    Appearance: Normal appearance. He is normal weight. He is not ill-appearing, toxic-appearing or diaphoretic.  HENT:     Head: Normocephalic and atraumatic.     Nose: Nose normal.     Mouth/Throat:     Mouth: Mucous membranes are moist.  Cardiovascular:     Rate and Rhythm: Normal rate and regular rhythm.      Pulses: Normal pulses.     Heart sounds: Normal heart sounds.  Pulmonary:     Effort: Pulmonary effort is normal.     Breath sounds: Normal breath sounds.  Abdominal:     General: Bowel sounds are normal.     Palpations: Abdomen is soft.  Musculoskeletal:        General: Normal range of motion.  Skin:    General: Skin is warm.     Capillary Refill: Capillary refill takes less than 2 seconds.  Neurological:     General: No focal deficit present.     Mental Status: He is alert and oriented to person, place, and time.  Psychiatric:        Mood and Affect: Mood normal.        Behavior: Behavior normal.        Thought Content: Thought content normal.        Judgment: Judgment normal.     BP 124/63 (BP Location: Right Arm, Patient Position: Sitting, Cuff Size: Normal)   Pulse 82   Temp 99.2 F (37.3 C) (Oral)   Resp 16   Ht 5\' 9"  (1.753 m)   Wt 176 lb (79.8 kg)   SpO2 98%   BMI 25.99 kg/m  Wt Readings from Last 3 Encounters:  09/22/19 176 lb (79.8 kg)  03/25/19 177 lb (80.3 kg)  09/22/18 167 lb (75.8 kg)     Health Maintenance Due  Topic Date Due  . Hepatitis C Screening  Never done    There are no preventive care reminders to display for this patient.  No results found for: TSH Lab Results  Component Value Date   WBC 5.0 09/22/2019   HGB 11.8 (L) 09/22/2019   HCT 32.7 (L) 09/22/2019   MCV 113 (H) 09/22/2019   PLT 684 (H) 09/22/2019   Lab Results  Component Value Date   NA 137 09/22/2019   K 4.5 09/22/2019   CO2 21 09/22/2019   GLUCOSE 114 (H) 09/22/2019   BUN 9 09/22/2019   CREATININE 0.78 09/22/2019   BILITOT 0.7 09/22/2019   ALKPHOS 57 09/22/2019   AST 28 09/22/2019   ALT 27 09/22/2019   PROT 7.0 09/22/2019   ALBUMIN 4.5 09/22/2019   CALCIUM 9.2 09/22/2019   ANIONGAP 7 04/05/2016   No results found for: CHOL No results found for: HDL No results found for: LDLCALC No results found for: TRIG No results found for: CHOLHDL No results found for:  HGBA1C    Assessment & Plan:   Problem  List Items Addressed This Visit      Other   Sickle cell anemia ([place]) - Primary Encourage patient to continue with the self care that he is doing to keep his SCD well controlled . Labs pending   Relevant Orders   Urinalysis Dipstick (Completed)   Systemic lupus panel-comprehensive   Sickle Cell Panel (Completed)    Other Visit Diagnoses    Family history of lupus erythematosus       Relevant Orders   Lupus (SLE) Analysis (Completed)      No orders of the defined types were placed in this encounter.   Follow-up: Return in about 3 months (around 12/23/2019).    Vevelyn Francois, NP

## 2019-09-27 LAB — CMP14+CBC/D/PLT+FER+RETIC+V...
ALT: 27 IU/L (ref 0–44)
AST: 28 IU/L (ref 0–40)
Albumin/Globulin Ratio: 1.8 (ref 1.2–2.2)
Albumin: 4.5 g/dL (ref 4.1–5.2)
Alkaline Phosphatase: 57 IU/L (ref 48–121)
BUN/Creatinine Ratio: 12 (ref 9–20)
BUN: 9 mg/dL (ref 6–20)
Basophils Absolute: 0 10*3/uL (ref 0.0–0.2)
Basos: 0 %
Bilirubin Total: 0.7 mg/dL (ref 0.0–1.2)
CO2: 21 mmol/L (ref 20–29)
Calcium: 9.2 mg/dL (ref 8.7–10.2)
Chloride: 103 mmol/L (ref 96–106)
Creatinine, Ser: 0.78 mg/dL (ref 0.76–1.27)
EOS (ABSOLUTE): 0.1 10*3/uL (ref 0.0–0.4)
Eos: 2 %
Ferritin: 436 ng/mL — ABNORMAL HIGH (ref 30–400)
GFR calc Af Amer: 144 mL/min/{1.73_m2} (ref >59)
GFR calc non Af Amer: 125 mL/min/{1.73_m2} (ref >59)
Globulin, Total: 2.5 g/dL (ref 1.5–4.5)
Glucose: 114 mg/dL — ABNORMAL HIGH (ref 65–99)
Hematocrit: 32.7 % — ABNORMAL LOW (ref 37.5–51.0)
Hemoglobin: 11.8 g/dL — ABNORMAL LOW (ref 13.0–17.7)
Immature Grans (Abs): 0 10*3/uL (ref 0.0–0.1)
Immature Granulocytes: 0 %
Lymphocytes Absolute: 2 10*3/uL (ref 0.7–3.1)
Lymphs: 40 %
MCH: 40.8 pg — ABNORMAL HIGH (ref 26.6–33.0)
MCHC: 36.1 g/dL — ABNORMAL HIGH (ref 31.5–35.7)
MCV: 113 fL — ABNORMAL HIGH (ref 79–97)
Monocytes Absolute: 0.5 10*3/uL (ref 0.1–0.9)
Monocytes: 10 %
NRBC: 16 % — ABNORMAL HIGH (ref 0–0)
Neutrophils Absolute: 2.4 10*3/uL (ref 1.4–7.0)
Neutrophils: 48 %
Platelets: 684 10*3/uL — ABNORMAL HIGH (ref 150–450)
Potassium: 4.5 mmol/L (ref 3.5–5.2)
RBC: 2.89 x10E6/uL — ABNORMAL LOW (ref 4.14–5.80)
RDW: 17.1 % — ABNORMAL HIGH (ref 11.6–15.4)
Retic Ct Pct: 4.2 % — ABNORMAL HIGH (ref 0.6–2.6)
Sodium: 137 mmol/L (ref 134–144)
Total Protein: 7 g/dL (ref 6.0–8.5)
Vit D, 25-Hydroxy: 8.3 ng/mL — ABNORMAL LOW (ref 30.0–100.0)
WBC: 5 10*3/uL (ref 3.4–10.8)

## 2019-09-27 LAB — LUPUS (SLE) ANALYSIS
Anti Nuclear Antibody (ANA): NEGATIVE
Anti-striation Abs: NEGATIVE
Complement C4, Serum: 16 mg/dL (ref 12–38)
ENA RNP Ab: <0.2 AI (ref 0.0–0.9)
ENA SM Ab Ser-aCnc: <0.2 AI (ref 0.0–0.9)
ENA SSA (RO) Ab: <0.2 AI (ref 0.0–0.9)
ENA SSB (LA) Ab: <0.2 AI (ref 0.0–0.9)
Mitochondrial Ab: <20 Units (ref 0.0–20.0)
Parietal Cell Ab: 12.7 Units (ref 0.0–20.0)
Scleroderma (Scl-70) (ENA) Antibody, IgG: <0.2 AI (ref 0.0–0.9)
Smooth Muscle Ab: 6 Units (ref 0–19)
Thyroperoxidase Ab SerPl-aCnc: <8 IU/mL (ref 0–34)
dsDNA Ab: <1 IU/mL (ref 0–9)

## 2019-09-28 ENCOUNTER — Encounter: Payer: Self-pay | Admitting: Nurse Practitioner

## 2019-09-28 ENCOUNTER — Other Ambulatory Visit: Payer: Self-pay | Admitting: Nurse Practitioner

## 2019-09-28 MED ORDER — ERGOCALCIFEROL 1.25 MG (50000 UT) PO CAPS: 50000.0000 [IU] | ORAL_CAPSULE | ORAL | 0 refills | Status: AC

## 2020-03-23 ENCOUNTER — Ambulatory Visit (INDEPENDENT_AMBULATORY_CARE_PROVIDER_SITE_OTHER): Payer: Managed Care, Other (non HMO) | Admitting: Nurse Practitioner

## 2020-03-23 ENCOUNTER — Encounter: Payer: Self-pay | Admitting: Nurse Practitioner

## 2020-03-23 ENCOUNTER — Other Ambulatory Visit: Payer: Self-pay

## 2020-03-23 VITALS — BP 125/59 | HR 79 | Temp 98.2°F | Ht 69.0 in | Wt 176.8 lb

## 2020-03-23 DIAGNOSIS — E559 Vitamin D deficiency, unspecified: Secondary | ICD-10-CM

## 2020-03-23 DIAGNOSIS — D571 Sickle-cell disease without crisis: Secondary | ICD-10-CM | POA: Diagnosis not present

## 2020-03-23 DIAGNOSIS — Z566 Other physical and mental strain related to work: Secondary | ICD-10-CM

## 2020-03-23 DIAGNOSIS — Z1159 Encounter for screening for other viral diseases: Secondary | ICD-10-CM

## 2020-03-23 LAB — POCT URINALYSIS DIPSTICK
Bilirubin, UA: NEGATIVE
Blood, UA: NEGATIVE
Clarity, UA: NEGATIVE
Color, UA: NEGATIVE
Glucose, UA: NEGATIVE
Ketones, UA: NEGATIVE
Leukocytes, UA: NEGATIVE
Nitrite, UA: NEGATIVE
Protein, UA: NEGATIVE
Spec Grav, UA: 1.01 (ref 1.010–1.025)
Urobilinogen, UA: 0.2 E.U./dL
pH, UA: 7.5 (ref 5.0–8.0)

## 2020-03-23 MED ORDER — HYDROXYUREA 500 MG PO CAPS: 1500.0000 mg | ORAL_CAPSULE | Freq: Every day | ORAL | 3 refills | Status: DC

## 2020-03-23 MED ORDER — FOLIC ACID 1 MG PO TABS: 1.0000 mg | ORAL_TABLET | Freq: Every day | ORAL | 3 refills | Status: AC

## 2020-03-23 NOTE — Patient Instructions (Addendum)

## 2020-03-23 NOTE — Progress Notes (Signed)
Established Patient Office Visit  Subjective:  Patient ID: Jason Franklin, male    DOB: 1996-02-12  Age: 25 y.o. MRN: [id number]  CC:  Chief Complaint  Patient presents with  . Follow-up    Follow up , sickle cell     HPI Jason Franklin 25 year old male presents for follow up for SCD. He is a very pleasant patient. The patient denies complaints of any kind and reports feeling well. Discussed history of SCD and patient stated that he has not had any concerns with pain in a very long time. Asthma history discussed and he has not needed to use rescue inhaler.    Past Medical History:  Diagnosis Date  . Asthma   . 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 SPLENECTOMY      Family History  Problem Relation Age of Onset  . Sickle cell trait Mother   . Sickle cell trait Father   . Diabetes Mellitus II Paternal Grandmother     Social History   Socioeconomic History  . Marital status: Single    Spouse name: Not on file  . Number of children: Not on file  . Years of education: Not on file  . Highest education level: Not on file  Occupational History  . Not on file  Tobacco Use  . Smoking status: Never Smoker  . Smokeless tobacco: Former Systems developer  . Tobacco comment: vapor   Vaping Use  . Vaping Use: Some days  Substance and Sexual Activity  . Alcohol use: Yes    Comment: occassional  . Drug use: Yes    Types: Marijuana  . Sexual activity: Yes    Birth control/protection: Condom  Other Topics Concern  . Not on file  Social History Narrative  . Not on file   Social Determinants of Health   Financial Resource Strain: Not on file  Food Insecurity: Not on file  Transportation Needs: Not on file  Physical Activity: Not on file  Stress: Not on file  Social Connections: Not on file  Intimate Partner Violence: Not on file    Outpatient Medications Prior to Visit  Medication Sig Dispense Refill  . hydroxyurea (HYDREA) 500 MG  capsule Take 3 capsules (1,500 mg total) by mouth daily. May take with food to minimize GI side effects. 119 capsule 3  . folic acid (FOLVITE) 1 MG tablet Take 1 tablet (1 mg total) by mouth daily. (Patient not taking: Reported on 03/23/2020) 90 tablet 3   No facility-administered medications prior to visit.  The patient reports taking medication hydroxyurea 500 mg (3 capsules) daily as prescribed. He has not ben taking folic acid 1 mg daily and vitamin D as prescribed previously. Additional education provided regarding medications and patient is agreeable to medication regimen.  No Known Allergies  ROS Review of Systems  Constitutional: Negative.  Negative for activity change, appetite change, chills and fatigue.  HENT: Negative.  Negative for congestion.   Eyes:       Patient wears eyeglasses and has a plan for annual ophthalmology exam. Denies changes in vision, pain or discomfort.   Respiratory: Negative.  Negative for cough, chest tightness and shortness of breath.   Cardiovascular: Negative.  Negative for chest pain, palpitations and leg swelling.  Gastrointestinal: Negative.  Negative for abdominal distention, abdominal pain, anal bleeding, blood in stool, constipation, diarrhea and nausea.  Endocrine: Negative.  Negative for cold intolerance, heat intolerance, polydipsia,  polyphagia and polyuria.  Genitourinary: Negative.  Negative for difficulty urinating, dysuria, flank pain, frequency and urgency.  Musculoskeletal: Negative.   Skin: Negative.   Allergic/Immunologic: Negative.  Negative for environmental allergies and food allergies.  Neurological: Negative.  Negative for dizziness, weakness, light-headedness and numbness.  Hematological: Negative.   Psychiatric/Behavioral: Negative.  Negative for suicidal ideas. The patient is not nervous/anxious.       Objective:    Physical Exam Constitutional:      Appearance: Normal appearance.  HENT:     Head: Normocephalic.     Right  Ear: Tympanic membrane, ear canal and external ear normal.     Left Ear: Tympanic membrane and ear canal normal.     Nose: Nose normal.     Mouth/Throat:     Mouth: Mucous membranes are moist.     Pharynx: Oropharynx is clear.  Eyes:     Extraocular Movements: Extraocular movements intact.     Conjunctiva/sclera: Conjunctivae normal.     Pupils: Pupils are equal, round, and reactive to light.  Cardiovascular:     Rate and Rhythm: Normal rate and regular rhythm.     Pulses: Normal pulses.     Heart sounds: Normal heart sounds.  Pulmonary:     Effort: Pulmonary effort is normal. No respiratory distress.     Breath sounds: Normal breath sounds. No wheezing.  Chest:     Chest wall: No tenderness.  Abdominal:     General: Abdomen is flat. Bowel sounds are normal. There is no distension.     Palpations: Abdomen is soft.     Tenderness: There is no abdominal tenderness. There is no right CVA tenderness, left CVA tenderness or guarding.  Musculoskeletal:        General: Normal range of motion.     Cervical back: Normal range of motion.  Skin:    General: Skin is warm and dry.     Findings: No bruising.  Neurological:     General: No focal deficit present.     Mental Status: He is alert and oriented to person, place, and time.  Psychiatric:        Mood and Affect: Mood normal.        Behavior: Behavior normal.     BP (!) 125/59 (BP Location: Left Arm, Patient Position: Sitting, Cuff Size: Normal)   Pulse 79   Temp 98.2 F (36.8 C) (Temporal)   Ht 5\' 9"  (1.753 m)   Wt 176 lb 12.8 oz (80.2 kg)   SpO2 98%   BMI 26.11 kg/m  Wt Readings from Last 3 Encounters:  03/23/20 176 lb 12.8 oz (80.2 kg)  09/22/19 176 lb (79.8 kg)  03/25/19 177 lb (80.3 kg)     Health Maintenance Due  Topic Date Due  . Hepatitis C Screening  Never done    There are no preventive care reminders to display for this patient.  No results found for: TSH Lab Results  Component Value Date   WBC 5.0  09/22/2019   HGB 11.8 (L) 09/22/2019   HCT 32.7 (L) 09/22/2019   MCV 113 (H) 09/22/2019   PLT 684 (H) 09/22/2019   Lab Results  Component Value Date   NA 137 09/22/2019   K 4.5 09/22/2019   CO2 21 09/22/2019   GLUCOSE 114 (H) 09/22/2019   BUN 9 09/22/2019   CREATININE 0.78 09/22/2019   BILITOT 0.7 09/22/2019   ALKPHOS 57 09/22/2019   AST 28 09/22/2019   ALT 27 09/22/2019  PROT 7.0 09/22/2019   ALBUMIN 4.5 09/22/2019   CALCIUM 9.2 09/22/2019   ANIONGAP 7 04/05/2016   No results found for: CHOL No results found for: HDL No results found for: LDLCALC No results found for: TRIG No results found for: CHOLHDL No results found for: HGBA1C    Assessment & Plan:   Problem List Items Addressed This Visit   None   Visit Diagnoses    Hb-SS disease without crisis ([place])    -  Primary   Relevant Medications   hydroxyurea (HYDREA) [id number] MG capsule   folic acid (FOLVITE) 1 MG tablet   Other Relevant Orders   POCT Urinalysis Dipstick   Sickle Cell Panel   Encounter for hepatitis C screening test for low risk patient       Vitamin D deficiency       Stress at work        The patient is working full time and will be taking ownership of an additional role. When the patient spoke about the new role and responsibilities he expressed feeling some amount of stress. He is planning to enroll in school full time to pursue degree in histology. He is also learning [place].   The patient reports exercising daily, walking and running on the treadmill four days a week. Activity is tolerated well.  Meds ordered this encounter  Medications  . hydroxyurea (HYDREA) 500 MG capsule    Sig: Take 3 capsules (1,500 mg total) by mouth daily. May take with food to minimize GI side effects.    Dispense:  270 capsule    Refill:  3    Order Specific Question:   Supervising Provider    Answer:   Tresa Garter [id number]  . folic acid (FOLVITE) 1 MG tablet    Sig: Take 1 tablet (1 mg total) by  mouth daily.    Dispense:  90 tablet    Refill:  3    Order Specific Question:   Supervising Provider    Answer:   Tresa Garter [id number]    Follow-up: Return in about 6 months (around 09/20/2020) for Follow up SCD 53664.    Hermina Staggers, RN

## 2020-03-24 LAB — CMP14+CBC/D/PLT+FER+RETIC+V...
ALT: 26 IU/L (ref 0–44)
AST: 25 IU/L (ref 0–40)
Albumin/Globulin Ratio: 1.8 (ref 1.2–2.2)
Albumin: 4.7 g/dL (ref 4.1–5.2)
Alkaline Phosphatase: 57 IU/L (ref 44–121)
BUN/Creatinine Ratio: 9 (ref 9–20)
BUN: 8 mg/dL (ref 6–20)
Basophils Absolute: 0 10*3/uL (ref 0.0–0.2)
Basos: 0 %
Bilirubin Total: 0.9 mg/dL (ref 0.0–1.2)
CO2: 22 mmol/L (ref 20–29)
Calcium: 9.5 mg/dL (ref 8.7–10.2)
Chloride: 105 mmol/L (ref 96–106)
Creatinine, Ser: 0.9 mg/dL (ref 0.76–1.27)
EOS (ABSOLUTE): 0 10*3/uL (ref 0.0–0.4)
Eos: 1 %
Ferritin: 361 ng/mL (ref 30–400)
GFR calc Af Amer: 136 mL/min/{1.73_m2} (ref >59)
GFR calc non Af Amer: 117 mL/min/{1.73_m2} (ref >59)
Globulin, Total: 2.6 g/dL (ref 1.5–4.5)
Glucose: 98 mg/dL (ref 65–99)
Hematocrit: 34.7 % — ABNORMAL LOW (ref 37.5–51.0)
Hemoglobin: 12.8 g/dL — ABNORMAL LOW (ref 13.0–17.7)
Immature Grans (Abs): 0 10*3/uL (ref 0.0–0.1)
Immature Granulocytes: 0 %
Lymphocytes Absolute: 2.6 10*3/uL (ref 0.7–3.1)
Lymphs: 48 %
MCH: 40.3 pg — ABNORMAL HIGH (ref 26.6–33.0)
MCHC: 36.9 g/dL — ABNORMAL HIGH (ref 31.5–35.7)
MCV: 109 fL — ABNORMAL HIGH (ref 79–97)
Monocytes Absolute: 0.6 10*3/uL (ref 0.1–0.9)
Monocytes: 12 %
NRBC: 18 % — ABNORMAL HIGH (ref 0–0)
Neutrophils Absolute: 2.1 10*3/uL (ref 1.4–7.0)
Neutrophils: 39 %
Platelets: 234 10*3/uL (ref 150–450)
Potassium: 4.2 mmol/L (ref 3.5–5.2)
RBC: 3.18 x10E6/uL — ABNORMAL LOW (ref 4.14–5.80)
RDW: 14.5 % (ref 11.6–15.4)
Retic Ct Pct: 3.1 % — ABNORMAL HIGH (ref 0.6–2.6)
Sodium: 141 mmol/L (ref 134–144)
Total Protein: 7.3 g/dL (ref 6.0–8.5)
Vit D, 25-Hydroxy: 13.4 ng/mL — ABNORMAL LOW (ref 30.0–100.0)
WBC: 5.3 10*3/uL (ref 3.4–10.8)

## 2020-04-14 ENCOUNTER — Other Ambulatory Visit: Payer: Self-pay | Admitting: Nurse Practitioner

## 2020-04-14 DIAGNOSIS — D571 Sickle-cell disease without crisis: Secondary | ICD-10-CM

## 2020-09-20 ENCOUNTER — Ambulatory Visit (INDEPENDENT_AMBULATORY_CARE_PROVIDER_SITE_OTHER): Payer: Managed Care, Other (non HMO) | Admitting: Nurse Practitioner

## 2020-09-20 ENCOUNTER — Encounter: Payer: Self-pay | Admitting: Nurse Practitioner

## 2020-09-20 ENCOUNTER — Other Ambulatory Visit: Payer: Self-pay

## 2020-09-20 VITALS — BP 128/74 | HR 94 | Temp 97.7°F | Ht 69.0 in | Wt 179.1 lb

## 2020-09-20 DIAGNOSIS — D571 Sickle-cell disease without crisis: Secondary | ICD-10-CM

## 2020-09-20 LAB — POCT URINALYSIS DIPSTICK
Bilirubin, UA: NEGATIVE
Blood, UA: NEGATIVE
Glucose, UA: NEGATIVE
Ketones, UA: NEGATIVE
Leukocytes, UA: NEGATIVE
Nitrite, UA: NEGATIVE
Protein, UA: NEGATIVE
Spec Grav, UA: 1.015 (ref 1.010–1.025)
Urobilinogen, UA: 2 E.U./dL — AB
pH, UA: 7 (ref 5.0–8.0)

## 2020-09-20 NOTE — Patient Instructions (Signed)
Sickle Cell Anemia, Adult  Sickle cell anemia is a condition where your red blood cells are shaped like sickles. Red blood cells carry oxygen through the body. Sickle-shaped cells do not live as long as normal red blood cells. They also clump together and block blood from flowing through the blood vessels. This prevents the body from getting enough oxygen. Sickle cell anemia causes organ damage and pain. It alsoincreases the risk of infection. Follow these instructions at home: Medicines Take over-the-counter and prescription medicines only as told by your doctor. If you were prescribed an antibiotic medicine, take it as told by your doctor. Do not stop taking the antibiotic even if you start to feel better. If you develop a fever, do not take medicines to lower the fever right away. Tell your doctor about the fever. Managing pain, stiffness, and swelling Try these methods to help with pain: Use a heating pad. Take a warm bath. Distract yourself, such as by watching TV. Eating and drinking Drink enough fluid to keep your pee (urine) clear or pale yellow. Drink more in hot weather and during exercise. Limit or avoid alcohol. Eat a healthy diet. Eat plenty of fruits, vegetables, whole grains, and lean protein. Take vitamins and supplements as told by your doctor. Traveling When traveling, keep these with you: Your medical information. The names of your doctors. Your medicines. If you need to take an airplane, talk to your doctor first. Activity Rest often. Avoid exercises that make your heart beat much faster, such as jogging. General instructions Do not use products that have nicotine or tobacco, such as cigarettes and e-cigarettes. If you need help quitting, ask your doctor. Consider wearing a medical alert bracelet. Avoid being in high places (high altitudes), such as mountains. Avoid very hot or cold temperatures. Avoid places where the temperature changes a lot. Keep all follow-up  visits as told by your doctor. This is important. Contact a doctor if: A joint hurts. Your feet or hands hurt or swell. You feel tired (fatigued). Get help right away if: You have symptoms of infection. These include: Fever. Chills. Being very tired. Irritability. Poor eating. Throwing up (vomiting). You feel dizzy or faint. You have new stomach pain, especially on the left side. You have a an erection (priapism) that lasts more than 4 hours. You have numbness in your arms or legs. You have a hard time moving your arms or legs. You have trouble talking. You have pain that does not go away when you take medicine. You are short of breath. You are breathing fast. You have a long-term cough. You have pain in your chest. You have a bad headache. You have a stiff neck. Your stomach looks bloated even though you did not eat much. Your skin is pale. You suddenly cannot see well. Summary Sickle cell anemia is a condition where your red blood cells are shaped like sickles. Follow your doctor's advice on ways to manage pain, food to eat, activities to do, and steps to take for safe travel. Get medical help right away if you have any signs of infection, such as a fever. This information is not intended to replace advice given to you by your health care provider. Make sure you discuss any questions you have with your healthcare provider. Document Revised: 06/30/2019 Document Reviewed: 06/30/2019 Elsevier Patient Education  [place].

## 2020-09-20 NOTE — Progress Notes (Addendum)
[place] [place], [place]  [address] Phone:  [phone]   Fax:  [phone]   Established Patient Office Visit  Subjective:  Patient ID: Jason Franklin, male    DOB: [birth year]  Age: 25 y.o. MRN: [id number]  CC:  Chief Complaint  Patient presents with   Follow-up   HPI POL HODO presents for follow up. He  has a past medical history of Asthma and Sickle cell disease ([place]).   Jason Franklin continues to do well.  He works full-time at [employer] and has recently completed a semester of college.  He is majoring in Histology.  He has to wait until next fall to resume his classes needed to complete this degree.  He currently holds a bachelor's degree. He is also completed 1 year of learning the [place] language.  He is travel to [place] since his last visit and the trip went well.  Denies fever, headache, cough, wheezing, shortness of breath, chest pains, abdominal pain, back pain, hip pain, or leg pain. Denies any open wounds, skin irritation.  Past Medical History:  Diagnosis Date   Asthma    Sickle cell disease ([place])     Past Surgical History:  Procedure Laterality Date   SPLENECTOMY      Family History  Problem Relation Age of Onset   Sickle cell trait Mother    Sickle cell trait Father    Diabetes Mellitus II Paternal Grandmother     Social History   Socioeconomic History   Marital status: Single    Spouse name: Not on file   Number of children: Not on file   Years of education: Not on file   Highest education level: Not on file  Occupational History   Not on file  Tobacco Use   Smoking status: Never   Smokeless tobacco: Former   Tobacco comments:    vapor   Vaping Use   Vaping Use: Some days  Substance and Sexual Activity   Alcohol use: Yes    Comment: occassional   Drug use: Yes    Types: Marijuana   Sexual activity: Yes    Birth control/protection: Condom  Other Topics Concern   Not on file  Social  History Narrative   Not on file   Social Determinants of Health   Financial Resource Strain: Not on file  Food Insecurity: Not on file  Transportation Needs: Not on file  Physical Activity: Not on file  Stress: Not on file  Social Connections: Not on file  Intimate Partner Violence: Not on file    Outpatient Medications Prior to Visit  Medication Sig Dispense Refill   hydroxyurea (HYDREA) 500 MG capsule TAKE 3 CAPSULES(1500 MG) BY MOUTH DAILY. MAY TAKE WITH FOOD TO MINIMIZE GI SIDE EFFECTS [id number] capsule 3   folic acid (FOLVITE) 1 MG tablet Take 1 tablet (1 mg total) by mouth daily. (Patient not taking: Reported on 09/20/2020) 90 tablet 3   No facility-administered medications prior to visit.    No Known Allergies  ROS Review of Systems    Objective:    Physical Exam HENT:     Head: Normocephalic and atraumatic.     Nose: Nose normal.     Mouth/Throat:     Mouth: Mucous membranes are moist.  Cardiovascular:     Rate and Rhythm: Normal rate and regular rhythm.     Pulses: Normal pulses.     Heart sounds: Normal heart sounds.  Pulmonary:  Effort: Pulmonary effort is normal.     Breath sounds: Normal breath sounds.  Abdominal:     Palpations: Abdomen is soft.  Musculoskeletal:        General: Normal range of motion.     Cervical back: Normal range of motion.  Skin:    General: Skin is warm and dry.     Capillary Refill: Capillary refill takes less than 2 seconds.  Neurological:     General: No focal deficit present.     Mental Status: He is alert.  Psychiatric:        Mood and Affect: Mood normal.        Behavior: Behavior normal.        Thought Content: Thought content normal.        Judgment: Judgment normal.    BP 128/74 (BP Location: Right Arm, Patient Position: Sitting)   Pulse 94   Temp 97.7 F (36.5 C)   Ht '5\' 9"'$  (1.753 m)   Wt 179 lb 0.8 oz (81.2 kg)   SpO2 98%   BMI 26.44 kg/m  Wt Readings from Last 3 Encounters:  09/20/20 179 lb 0.8 oz (81.2  kg)  03/23/20 176 lb 12.8 oz (80.2 kg)  09/22/19 176 lb (79.8 kg)     There are no preventive care reminders to display for this patient.   There are no preventive care reminders to display for this patient.  No results found for: TSH Lab Results  Component Value Date   WBC 5.3 03/23/2020   HGB 12.8 (L) 03/23/2020   HCT 34.7 (L) 03/23/2020   MCV 109 (H) 03/23/2020   PLT 234 03/23/2020   Lab Results  Component Value Date   NA 141 03/23/2020   K 4.2 03/23/2020   CO2 22 03/23/2020   GLUCOSE 98 03/23/2020   BUN 8 03/23/2020   CREATININE 0.90 03/23/2020   BILITOT 0.9 03/23/2020   ALKPHOS 57 03/23/2020   AST 25 03/23/2020   ALT 26 03/23/2020   PROT 7.3 03/23/2020   ALBUMIN 4.7 03/23/2020   CALCIUM 9.5 03/23/2020   ANIONGAP 7 04/05/2016   No results found for: CHOL No results found for: HDL No results found for: LDLCALC No results found for: TRIG No results found for: CHOLHDL No results found for: HGBA1C    Assessment & Plan:   Problem List Items Addressed This Visit   None Visit Diagnoses     Hb-SS disease without crisis ([place])    -  Primary Congratulated patient on continued self-care with proper dietary intake and regular exercise Ensure adequate hydration. Move frequently to reduce venous thromboembolism risk. Avoid situations that could lead to dehydration or could exacerbate pain    Relevant Orders   Urinalysis Dipstick (Completed)   Sickle Cell Panel       No orders of the defined types were placed in this encounter.   Follow-up: Return in about 6 months (around 03/23/2021) for Follow up SCD 19147.    Vevelyn Francois, NP

## 2020-09-21 LAB — CMP14+CBC/D/PLT+FER+RETIC+V...
ALT: 32 IU/L (ref 0–44)
AST: 29 IU/L (ref 0–40)
Albumin/Globulin Ratio: 1.8 (ref 1.2–2.2)
Albumin: 4.9 g/dL (ref 4.1–5.2)
Alkaline Phosphatase: 51 IU/L (ref 44–121)
BUN/Creatinine Ratio: 14 (ref 9–20)
BUN: 11 mg/dL (ref 6–20)
Basophils Absolute: 0 10*3/uL (ref 0.0–0.2)
Basos: 0 %
Bilirubin Total: 0.8 mg/dL (ref 0.0–1.2)
CO2: 20 mmol/L (ref 20–29)
Calcium: 10.1 mg/dL (ref 8.7–10.2)
Chloride: 102 mmol/L (ref 96–106)
Creatinine, Ser: 0.79 mg/dL (ref 0.76–1.27)
EOS (ABSOLUTE): 0.1 10*3/uL (ref 0.0–0.4)
Eos: 1 %
Ferritin: 442 ng/mL — ABNORMAL HIGH (ref 30–400)
Globulin, Total: 2.8 g/dL (ref 1.5–4.5)
Glucose: 94 mg/dL (ref 65–99)
Hematocrit: 36.7 % — ABNORMAL LOW (ref 37.5–51.0)
Hemoglobin: 13.1 g/dL (ref 13.0–17.7)
Lymphocytes Absolute: 2.4 10*3/uL (ref 0.7–3.1)
Lymphs: 46 %
MCH: 39.9 pg — ABNORMAL HIGH (ref 26.6–33.0)
MCHC: 35.7 g/dL (ref 31.5–35.7)
MCV: 112 fL — ABNORMAL HIGH (ref 79–97)
Monocytes Absolute: 0.5 10*3/uL (ref 0.1–0.9)
Monocytes: 10 %
NRBC: 26 % — ABNORMAL HIGH (ref 0–0)
Neutrophils Absolute: 2.3 10*3/uL (ref 1.4–7.0)
Neutrophils: 43 %
Platelets: 680 10*3/uL — ABNORMAL HIGH (ref 150–450)
Potassium: 4.7 mmol/L (ref 3.5–5.2)
RBC: 3.28 x10E6/uL — ABNORMAL LOW (ref 4.14–5.80)
RDW: 15.7 % — ABNORMAL HIGH (ref 11.6–15.4)
Retic Ct Pct: 4.3 % — ABNORMAL HIGH (ref 0.6–2.6)
Sodium: 139 mmol/L (ref 134–144)
Total Protein: 7.7 g/dL (ref 6.0–8.5)
Vit D, 25-Hydroxy: 20.7 ng/mL — ABNORMAL LOW (ref 30.0–100.0)
WBC: 5.3 10*3/uL (ref 3.4–10.8)
eGFR: 125 mL/min/{1.73_m2} (ref >59)

## 2021-03-20 ENCOUNTER — Ambulatory Visit: Payer: Managed Care, Other (non HMO) | Admitting: Nurse Practitioner

## 2021-03-20 ENCOUNTER — Encounter: Payer: Self-pay | Admitting: Nurse Practitioner

## 2021-03-20 ENCOUNTER — Other Ambulatory Visit: Payer: Self-pay

## 2021-03-20 VITALS — BP 130/68 | HR 86 | Temp 98.6°F | Ht 69.0 in | Wt 186.4 lb

## 2021-03-20 DIAGNOSIS — E559 Vitamin D deficiency, unspecified: Secondary | ICD-10-CM

## 2021-03-20 DIAGNOSIS — D571 Sickle-cell disease without crisis: Secondary | ICD-10-CM

## 2021-03-20 MED ORDER — HYDROXYUREA 500 MG PO CAPS: 1500.0000 mg | ORAL_CAPSULE | Freq: Every day | ORAL | 3 refills | Status: DC

## 2021-03-20 NOTE — Patient Instructions (Signed)
Sickle Cell Anemia, Adult Sickle cell anemia is a condition where your red blood cells are shaped like sickles. Red blood cells carry oxygen through the body. Sickle-shaped cells do not live as long as normal red blood cells. They also clump together and block blood from flowing through the blood vessels. This prevents the body from getting enough oxygen. Sickle cell anemia causes organ damage and pain. It also increases the risk of infection. Follow these instructions at home: Medicines Take over-the-counter and prescription medicines only as told by your doctor. If you were prescribed an antibiotic medicine, take it as told by your doctor. Do not stop taking the antibiotic even if you start to feel better. If you develop a fever, do not take medicines to lower the fever right away. Tell your doctor about the fever. Managing pain, stiffness, and swelling Try these methods to help with pain: Use a heating pad. Take a warm bath. Distract yourself, such as by watching TV. Eating and drinking Drink enough fluid to keep your pee (urine) clear or pale yellow. Drink more in hot weather and during exercise. Limit or avoid alcohol. Eat a healthy diet. Eat plenty of fruits, vegetables, whole grains, and lean protein. Take vitamins and supplements as told by your doctor. Traveling When traveling, keep these with you: Your medical information. The names of your doctors. Your medicines. If you need to take an airplane, talk to your doctor first. Activity Rest often. Avoid exercises that make your heart beat much faster, such as jogging. General instructions Do not use products that have nicotine or tobacco, such as cigarettes and e-cigarettes. If you need help quitting, ask your doctor. Consider wearing a medical alert bracelet. Avoid being in high places (high altitudes), such as mountains. Avoid very hot or cold temperatures. Avoid places where the temperature changes a lot. Keep all follow-up  visits as told by your doctor. This is important. Contact a doctor if: A joint hurts. Your feet or hands hurt or swell. You feel tired (fatigued). Get help right away if: You have symptoms of infection. These include: Fever. Chills. Being very tired. Irritability. Poor eating. Throwing up (vomiting). You feel dizzy or faint. You have new stomach pain, especially on the left side. You have a an erection (priapism) that lasts more than 4 hours. You have numbness in your arms or legs. You have a hard time moving your arms or legs. You have trouble talking. You have pain that does not go away when you take medicine. You are short of breath. You are breathing fast. You have a long-term cough. You have pain in your chest. You have a bad headache. You have a stiff neck. Your stomach looks bloated even though you did not eat much. Your skin is pale. You suddenly cannot see well. Summary Sickle cell anemia is a condition where your red blood cells are shaped like sickles. Follow your doctor's advice on ways to manage pain, food to eat, activities to do, and steps to take for safe travel. Get medical help right away if you have any signs of infection, such as a fever. This information is not intended to replace advice given to you by your health care provider. Make sure you discuss any questions you have with your health care provider. Document Revised: 06/30/2019 Document Reviewed: 06/30/2019 Elsevier Patient Education  [place].

## 2021-03-20 NOTE — Progress Notes (Signed)
Established Patient Office Visit  Subjective:  Patient ID: Jason Franklin, male    DOB: 22-Nov-1995  Age: 26 y.o. MRN: [id number]  CC:  Chief Complaint  Patient presents with   Follow-up    Pt is here for follow up visit. No questions or concerns pt need a refill for hydroxyurea.    HPI Jason Franklin presents for follow up. He  has a past medical history of Asthma and Sickle cell disease ([place]).   He does well and denies any current pain. Denies fever, headache, cough, wheezing, shortness of breath, chest pains, abdominal pain, back pain, hip pain, or leg pain. Denies any open wounds, skin irritation. He has not been working out regularly since July but has restarted the gym due to circumstances beyond his control. He is going to schedule an eye exam and may consider contacts again. He is going back to school. He is planning a residential move.   Past Medical History:  Diagnosis Date   Asthma    Sickle cell disease ([place])     Past Surgical History:  Procedure Laterality Date   SPLENECTOMY      Family History  Problem Relation Age of Onset   Sickle cell trait Mother    Sickle cell trait Father    Diabetes Mellitus II Paternal Grandmother     Social History   Socioeconomic History   Marital status: Single    Spouse name: Not on file   Number of children: Not on file   Years of education: Not on file   Highest education level: Not on file  Occupational History   Not on file  Tobacco Use   Smoking status: Never   Smokeless tobacco: Former   Tobacco comments:    vapor   Vaping Use   Vaping Use: Some days  Substance and Sexual Activity   Alcohol use: Yes    Comment: occassional   Drug use: Yes    Types: Marijuana   Sexual activity: Yes    Birth control/protection: Condom  Other Topics Concern   Not on file  Social History Narrative   Not on file   Social Determinants of Health   Financial Resource Strain: Not on file  Food Insecurity: Not on file   Transportation Needs: Not on file  Physical Activity: Not on file  Stress: Not on file  Social Connections: Not on file  Intimate Partner Violence: Not on file    Outpatient Medications Prior to Visit  Medication Sig Dispense Refill   hydroxyurea (HYDREA) 500 MG capsule TAKE 3 CAPSULES(1500 MG) BY MOUTH DAILY. MAY TAKE WITH FOOD TO MINIMIZE GI SIDE EFFECTS 294 capsule 3   folic acid (FOLVITE) 1 MG tablet Take 1 tablet (1 mg total) by mouth daily. (Patient not taking: Reported on 09/20/2020) 90 tablet 3   No facility-administered medications prior to visit.    No Known Allergies  ROS Review of Systems    Objective:    Physical Exam HENT:     Head: Normocephalic and atraumatic.     Nose: Nose normal.     Mouth/Throat:     Mouth: Mucous membranes are moist.  Cardiovascular:     Rate and Rhythm: Normal rate and regular rhythm.     Pulses: Normal pulses.     Heart sounds: Normal heart sounds.  Pulmonary:     Effort: Pulmonary effort is normal.     Breath sounds: Normal breath sounds.  Abdominal:     Palpations: Abdomen  is soft.     Comments: hypoactive  Musculoskeletal:        General: Normal range of motion.     Cervical back: Normal range of motion.     Right lower leg: No edema.     Left lower leg: No edema.  Skin:    General: Skin is warm and dry.     Capillary Refill: Capillary refill takes less than 2 seconds.  Neurological:     General: No focal deficit present.     Mental Status: He is alert and oriented to person, place, and time.  Psychiatric:        Mood and Affect: Mood normal.        Behavior: Behavior normal.        Thought Content: Thought content normal.        Judgment: Judgment normal.    BP 130/68    Pulse 86    Temp 98.6 F (37 C)    Ht _0  (1.753 m)    Wt 186 lb 6 oz (84.5 kg)    SpO2 99%    BMI 27.52 kg/m  Wt Readings from Last 3 Encounters:  03/20/21 186 lb 6 oz (84.5 kg)  09/20/20 179 lb 0.8 oz (81.2 kg)  03/23/20 176 lb 12.8 oz  (80.2 kg)     Health Maintenance Due  Topic Date Due   COVID-19 Vaccine (3 - Booster for Pfizer series) 07/27/2019    There are no preventive care reminders to display for this patient.  No results found for: TSH Lab Results  Component Value Date   WBC 5.3 09/20/2020   HGB 13.1 09/20/2020   HCT 36.7 (L) 09/20/2020   MCV 112 (H) 09/20/2020   PLT 680 (H) 09/20/2020   Lab Results  Component Value Date   NA 139 09/20/2020   K 4.7 09/20/2020   CO2 20 09/20/2020   GLUCOSE 94 09/20/2020   BUN 11 09/20/2020   CREATININE 0.79 09/20/2020   BILITOT 0.8 09/20/2020   ALKPHOS 51 09/20/2020   AST 29 09/20/2020   ALT 32 09/20/2020   PROT 7.7 09/20/2020   ALBUMIN 4.9 09/20/2020   CALCIUM 10.1 09/20/2020   ANIONGAP 7 04/05/2016   EGFR 125 09/20/2020   No results found for: CHOL No results found for: HDL No results found for: LDLCALC No results found for: TRIG No results found for: CHOLHDL No results found for: HGBA1C    Assessment & Plan:   Problem List Items Addressed This Visit   None Visit Diagnoses     Hb-SS disease without crisis ([place])    -  Primary Ensure adequate hydration. Move frequently to reduce venous thromboembolism risk. Avoid situations that could lead to dehydration or could exacerbate pain Discussed S&S of infection, seizures, stroke acute chest, DVT and how important it is to seek medical attention Take medication as directed along with pain contract and overall compliance Discussed the risk related to opiate use (addition, tolerance and dependency)    Vitamin D deficiency     Stable        No orders of the defined types were placed in this encounter.   Follow-up: Return in about 6 months (around 09/17/2021) for Follow up SCD 17494.    Vevelyn Francois, NP

## 2021-03-21 LAB — CMP14+CBC/D/PLT+FER+RETIC+V...
ALT: 46 IU/L — ABNORMAL HIGH (ref 0–44)
AST: 33 IU/L (ref 0–40)
Albumin/Globulin Ratio: 1.7 (ref 1.2–2.2)
Albumin: 4.8 g/dL (ref 4.1–5.2)
Alkaline Phosphatase: 64 IU/L (ref 44–121)
BUN/Creatinine Ratio: 14 (ref 9–20)
BUN: 12 mg/dL (ref 6–20)
Basophils Absolute: 0.1 10*3/uL (ref 0.0–0.2)
Basos: 1 %
Bilirubin Total: 0.7 mg/dL (ref 0.0–1.2)
CO2: 22 mmol/L (ref 20–29)
Calcium: 9.8 mg/dL (ref 8.7–10.2)
Chloride: 104 mmol/L (ref 96–106)
Creatinine, Ser: 0.87 mg/dL (ref 0.76–1.27)
EOS (ABSOLUTE): 0.1 10*3/uL (ref 0.0–0.4)
Eos: 1 %
Ferritin: 496 ng/mL — ABNORMAL HIGH (ref 30–400)
Globulin, Total: 2.8 g/dL (ref 1.5–4.5)
Glucose: 87 mg/dL (ref 70–99)
Hematocrit: 34.2 % — ABNORMAL LOW (ref 37.5–51.0)
Hemoglobin: 12.2 g/dL — ABNORMAL LOW (ref 13.0–17.7)
Immature Grans (Abs): 0 10*3/uL (ref 0.0–0.1)
Immature Granulocytes: 0 %
Lymphocytes Absolute: 3.2 10*3/uL — ABNORMAL HIGH (ref 0.7–3.1)
Lymphs: 46 %
MCH: 38.7 pg — ABNORMAL HIGH (ref 26.6–33.0)
MCHC: 35.7 g/dL (ref 31.5–35.7)
MCV: 109 fL — ABNORMAL HIGH (ref 79–97)
Monocytes Absolute: 0.8 10*3/uL (ref 0.1–0.9)
Monocytes: 12 %
NRBC: 18 % — ABNORMAL HIGH (ref 0–0)
Neutrophils Absolute: 2.7 10*3/uL (ref 1.4–7.0)
Neutrophils: 40 %
Platelets: 515 10*3/uL — ABNORMAL HIGH (ref 150–450)
Potassium: 4.6 mmol/L (ref 3.5–5.2)
RBC: 3.15 x10E6/uL — ABNORMAL LOW (ref 4.14–5.80)
RDW: 15.2 % (ref 11.6–15.4)
Retic Ct Pct: 4.3 % — ABNORMAL HIGH (ref 0.6–2.6)
Sodium: 142 mmol/L (ref 134–144)
Total Protein: 7.6 g/dL (ref 6.0–8.5)
Vit D, 25-Hydroxy: 12.1 ng/mL — ABNORMAL LOW (ref 30.0–100.0)
WBC: 6.7 10*3/uL (ref 3.4–10.8)
eGFR: 121 mL/min/{1.73_m2} (ref >59)

## 2021-04-03 ENCOUNTER — Encounter: Payer: Self-pay | Admitting: Nurse Practitioner

## 2021-09-18 ENCOUNTER — Ambulatory Visit: Payer: Managed Care, Other (non HMO) | Admitting: Nurse Practitioner

## 2021-09-18 ENCOUNTER — Encounter: Payer: Self-pay | Admitting: Nurse Practitioner

## 2021-09-18 ENCOUNTER — Ambulatory Visit (INDEPENDENT_AMBULATORY_CARE_PROVIDER_SITE_OTHER): Payer: Managed Care, Other (non HMO) | Admitting: Nurse Practitioner

## 2021-09-18 DIAGNOSIS — D571 Sickle-cell disease without crisis: Secondary | ICD-10-CM | POA: Diagnosis not present

## 2021-09-18 MED ORDER — HYDROXYUREA 500 MG PO CAPS: 1500.0000 mg | ORAL_CAPSULE | Freq: Every day | ORAL | 3 refills | Status: AC

## 2021-09-18 NOTE — Patient Instructions (Signed)
1. Hb-SS disease without crisis ([place])  - hydroxyurea (HYDREA) 500 MG capsule; Take 3 capsules (1,500 mg total) by mouth daily. May take with food to minimize GI side effects.  Dispense: 270 capsule; Refill: 3 - Sickle Cell Panel  -Discussed need for good hydration  -Monitor hydration status  -Avoid heat, cold, stress, and infectious triggers  -Discussed the importance of adherence to medication regimen  -Please seek medical attention immediately if any symptoms of bleeding, anemia, infection occurs   Follow up:  Follow up in 6 months

## 2021-09-18 NOTE — Progress Notes (Signed)
'@Patient'  ID: Jason Franklin, male    DOB: 1995/10/27, 26 y.o.   MRN: [id number]  Chief Complaint  Patient presents with   Follow-up    Pt is here for 6 month's SCD follow up visit. Pt is requesting refill hydroxyurea    Referring provider: No ref. provider found   HPI   Jason Franklin presents for follow up. Jason Franklin  has a past medical history of Asthma and Sickle cell disease ([place]).   Jason Franklin does well and denies any current pain. Denies fever, headache, cough, wheezing, shortness of breath, chest pains, abdominal pain, back pain, hip pain, or leg pain. Denies any open wounds, skin irritation. Jason Franklin has been working out regularly. Jason Franklin is going to schedule an eye exam and may consider contacts again. Denies f/c/s, n/v/d, hemoptysis, PND, leg swelling Denies chest pain or edema    No Known Allergies  Immunization History  Administered Date(s) Administered   DTP 06/12/1993   DTaP 06/12/1993, 08/14/1993, 10/09/1993, 08/13/1994   Hepatitis B 06/18/1993, 10/09/1993   HiB (PRP-OMP) 06/12/1993, 08/14/1993, 10/09/1993   Influenza,inj,Quad PF,6+ Mos 03/04/2014, 11/27/2015, 03/11/2017   Influenza-Unspecified 02/25/2012   MMR 10/15/1994   PFIZER(Purple Top)SARS-COV-2 Vaccination 05/06/2019, 06/01/2019   Pneumococcal Polysaccharide-23 07/30/1994   Td 08/12/2004   Tdap 04/08/2004, 03/10/2016    Past Medical History:  Diagnosis Date   Asthma    Sickle cell disease ([place])     Tobacco History: Social History   Tobacco Use  Smoking Status Never  Smokeless Tobacco Former  Tobacco Comments   vapor    Counseling given: Not Answered Tobacco comments: vapor    Outpatient Encounter Medications as of 09/18/2021  Medication Sig   [DISCONTINUED] hydroxyurea (HYDREA) 500 MG capsule Take 3 capsules (1,500 mg total) by mouth daily. May take with food to minimize GI side effects.   folic acid (FOLVITE) 1 MG tablet Take 1 tablet (1 mg total) by mouth daily. (Patient not taking: Reported on 09/20/2020)    hydroxyurea (HYDREA) 500 MG capsule Take 3 capsules (1,500 mg total) by mouth daily. May take with food to minimize GI side effects.   No facility-administered encounter medications on file as of 09/18/2021.     Review of Systems  Review of Systems  Constitutional: Negative.   HENT: Negative.    Cardiovascular: Negative.   Gastrointestinal: Negative.   Allergic/Immunologic: Negative.   Neurological: Negative.   Psychiatric/Behavioral: Negative.         Physical Exam  BP 121/75 (BP Location: Left Arm, Patient Position: Sitting, Cuff Size: Normal)   Pulse 80   Temp (!) 97.5 F (36.4 C)   Ht '5\' 9"'  (1.753 m)   Wt 182 lb 3.2 oz (82.6 kg)   SpO2 100%   BMI 26.91 kg/m   Wt Readings from Last 5 Encounters:  09/18/21 182 lb 3.2 oz (82.6 kg)  03/20/21 186 lb 6 oz (84.5 kg)  09/20/20 179 lb 0.8 oz (81.2 kg)  03/23/20 176 lb 12.8 oz (80.2 kg)  09/22/19 176 lb (79.8 kg)     Physical Exam Vitals and nursing note reviewed.  Constitutional:      General: Jason Franklin is not in acute distress.    Appearance: Jason Franklin is well-developed.  Cardiovascular:     Rate and Rhythm: Normal rate and regular rhythm.  Pulmonary:     Effort: Pulmonary effort is normal.     Breath sounds: Normal breath sounds.  Skin:    General: Skin is warm and dry.  Neurological:  Mental Status: Jason Franklin is alert and oriented to person, place, and time.       Assessment & Plan:   Hb-SS disease without crisis ([place]) - hydroxyurea (HYDREA) 500 MG capsule; Take 3 capsules (1,500 mg total) by mouth daily. May take with food to minimize GI side effects.  Dispense: 270 capsule; Refill: 3 - Sickle Cell Panel  -Discussed need for good hydration  -Monitor hydration status  -Avoid heat, cold, stress, and infectious triggers  -Discussed the importance of adherence to medication regimen  -Please seek medical attention immediately if any symptoms of bleeding, anemia, infection occurs   Follow up:  Follow up in 6 months       Fenton Foy, NP 09/18/2021

## 2021-09-18 NOTE — Assessment & Plan Note (Signed)
-   hydroxyurea (HYDREA) 500 MG capsule; Take 3 capsules (1,500 mg total) by mouth daily. May take with food to minimize GI side effects.  Dispense: 270 capsule; Refill: 3 - Sickle Cell Panel  -Discussed need for good hydration  -Monitor hydration status  -Avoid heat, cold, stress, and infectious triggers  -Discussed the importance of adherence to medication regimen  -Please seek medical attention immediately if any symptoms of bleeding, anemia, infection occurs   Follow up:  Follow up in 6 months

## 2021-09-20 LAB — CMP14+CBC/D/PLT+FER+RETIC+V...
ALT: 17 IU/L (ref 0–44)
AST: 21 IU/L (ref 0–40)
Albumin/Globulin Ratio: 1.8 (ref 1.2–2.2)
Albumin: 4.5 g/dL (ref 4.3–5.2)
Alkaline Phosphatase: 62 IU/L (ref 44–121)
BUN/Creatinine Ratio: 13 (ref 9–20)
BUN: 12 mg/dL (ref 6–20)
Basophils Absolute: 0 10*3/uL (ref 0.0–0.2)
Basos: 0 %
Bilirubin Total: 1 mg/dL (ref 0.0–1.2)
CO2: 21 mmol/L (ref 20–29)
Calcium: 9.1 mg/dL (ref 8.7–10.2)
Chloride: 107 mmol/L — ABNORMAL HIGH (ref 96–106)
Creatinine, Ser: 0.89 mg/dL (ref 0.76–1.27)
EOS (ABSOLUTE): 0.1 10*3/uL (ref 0.0–0.4)
Eos: 1 %
Ferritin: 496 ng/mL — ABNORMAL HIGH (ref 30–400)
Globulin, Total: 2.5 g/dL (ref 1.5–4.5)
Glucose: 93 mg/dL (ref 70–99)
Hematocrit: 34.2 % — ABNORMAL LOW (ref 37.5–51.0)
Hemoglobin: 12.2 g/dL — ABNORMAL LOW (ref 13.0–17.7)
Immature Grans (Abs): 0 10*3/uL (ref 0.0–0.1)
Immature Granulocytes: 0 %
Lymphocytes Absolute: 3.1 10*3/uL (ref 0.7–3.1)
Lymphs: 40 %
MCH: 40.7 pg — ABNORMAL HIGH (ref 26.6–33.0)
MCHC: 35.7 g/dL (ref 31.5–35.7)
MCV: 114 fL — ABNORMAL HIGH (ref 79–97)
Monocytes Absolute: 0.6 10*3/uL (ref 0.1–0.9)
Monocytes: 8 %
NRBC: 21 % — ABNORMAL HIGH (ref 0–0)
Neutrophils Absolute: 4 10*3/uL (ref 1.4–7.0)
Neutrophils: 51 %
Platelets: 140 10*3/uL — ABNORMAL LOW (ref 150–450)
Potassium: 4.4 mmol/L (ref 3.5–5.2)
RBC: 3 x10E6/uL — ABNORMAL LOW (ref 4.14–5.80)
RDW: 16.8 % — ABNORMAL HIGH (ref 11.6–15.4)
Retic Ct Pct: 4.3 % — ABNORMAL HIGH (ref 0.6–2.6)
Sodium: 142 mmol/L (ref 134–144)
Total Protein: 7 g/dL (ref 6.0–8.5)
Vit D, 25-Hydroxy: 18.1 ng/mL — ABNORMAL LOW (ref 30.0–100.0)
WBC: 7.8 10*3/uL (ref 3.4–10.8)
eGFR: 120 mL/min/{1.73_m2} (ref >59)

## 2022-03-21 ENCOUNTER — Ambulatory Visit: Payer: Managed Care, Other (non HMO) | Admitting: Nurse Practitioner
